# Patient Record
Sex: Female | Born: 1985 | Race: White | Hispanic: No | Marital: Married | Smoking: Never smoker
Health system: Southern US, Community
[De-identification: ages and names within clinical notes are randomized; demographics above are authoritative.]

## PROBLEM LIST (undated history)

## (undated) DIAGNOSIS — Z789 Other specified health status: Secondary | ICD-10-CM

## (undated) DIAGNOSIS — G43009 Migraine without aura, not intractable, without status migrainosus: Secondary | ICD-10-CM

## (undated) HISTORY — PX: NO PAST SURGERIES: SHX2092

## (undated) HISTORY — DX: Migraine without aura, not intractable, without status migrainosus: G43.009

---

## 2013-12-14 LAB — HIV ANTIBODY (ROUTINE TESTING W REFLEX): HIV: NEGATIVE

## 2013-12-14 LAB — TSH: TSH: 0.4 — AB (ref 0.41–5.90)

## 2013-12-14 LAB — CBC AND DIFFERENTIAL
HCT: 42 (ref 36–46)
Hemoglobin: 14.5 (ref 12.0–16.0)
PLATELETS: 259 (ref 150–399)
WBC: 10

## 2013-12-14 LAB — HEMOGLOBIN A1C: Hemoglobin A1C: 5.2

## 2013-12-14 LAB — HM HIV SCREENING LAB: HM HIV Screening: NEGATIVE

## 2014-05-23 LAB — CBC AND DIFFERENTIAL
HCT: 36 (ref 36–46)
HEMOGLOBIN: 12.9 (ref 12.0–16.0)
PLATELETS: 205 (ref 150–399)
WBC: 10.7

## 2015-04-08 LAB — CBC AND DIFFERENTIAL
HCT: 42 (ref 36–46)
HEMOGLOBIN: 14.4 (ref 12.0–16.0)
Platelets: 248 (ref 150–399)
WBC: 8.8

## 2015-04-08 LAB — TSH: TSH: 0.18 — AB (ref 0.41–5.90)

## 2015-04-08 LAB — HEMOGLOBIN A1C: Hemoglobin A1C: 5.3

## 2015-05-06 LAB — TSH: TSH: 0.25 — AB (ref 0.41–5.90)

## 2015-08-13 LAB — CBC AND DIFFERENTIAL
HEMATOCRIT: 36 (ref 36–46)
HEMOGLOBIN: 12.7 (ref 12.0–16.0)
PLATELETS: 192 (ref 150–399)
WBC: 10.2

## 2016-12-16 LAB — LIPID PANEL
CHOLESTEROL: 179 (ref 0–200)
LDL CALC: 95
Triglycerides: 47 (ref 40–160)

## 2016-12-16 LAB — HEPATIC FUNCTION PANEL
ALK PHOS: 68 (ref 25–125)
ALT: 11 (ref 7–35)
AST: 24 (ref 13–35)
Bilirubin, Total: 0.3

## 2016-12-16 LAB — TSH: TSH: 1.56 (ref 0.41–5.90)

## 2016-12-16 LAB — CBC AND DIFFERENTIAL
HEMATOCRIT: 45 (ref 36–46)
HEMOGLOBIN: 14.9 (ref 12.0–16.0)
Platelets: 286 (ref 150–399)
WBC: 9

## 2016-12-16 LAB — BASIC METABOLIC PANEL
BUN: 14 (ref 4–21)
CREATININE: 1.1 (ref 0.5–1.1)
Glucose: 87
Potassium: 4.7 (ref 3.4–5.3)
Sodium: 139 (ref 137–147)

## 2016-12-16 LAB — VITAMIN D 25 HYDROXY (VIT D DEFICIENCY, FRACTURES): Vit D, 25-Hydroxy: 34.8

## 2017-08-10 ENCOUNTER — Encounter: Payer: Self-pay | Admitting: Family Medicine

## 2017-08-10 ENCOUNTER — Ambulatory Visit (INDEPENDENT_AMBULATORY_CARE_PROVIDER_SITE_OTHER): Payer: No Typology Code available for payment source | Admitting: Family Medicine

## 2017-08-10 VITALS — BP 118/86 | HR 59 | Ht 63.0 in | Wt 136.8 lb

## 2017-08-10 DIAGNOSIS — N6011 Diffuse cystic mastopathy of right breast: Secondary | ICD-10-CM

## 2017-08-10 DIAGNOSIS — K5900 Constipation, unspecified: Secondary | ICD-10-CM

## 2017-08-10 DIAGNOSIS — R195 Other fecal abnormalities: Secondary | ICD-10-CM

## 2017-08-10 DIAGNOSIS — N6019 Diffuse cystic mastopathy of unspecified breast: Secondary | ICD-10-CM | POA: Insufficient documentation

## 2017-08-10 DIAGNOSIS — R140 Abdominal distension (gaseous): Secondary | ICD-10-CM

## 2017-08-10 NOTE — Progress Notes (Signed)
New patient office visit note:  Impression and Recommendations:    1. Abdominal bloating   2. Fibrocystic changes of right breast   3. Constipation, unspecified constipation type   4. Loose stools      Abdominal bloating - Plan: CBC with Differential/Platelet, Comprehensive metabolic panel, Hemoglobin A1c, Lipid panel, Magnesium, Phosphorus, T4, free, TSH, VITAMIN D 25 Hydroxy (Vit-D Deficiency, Fractures)  Fibrocystic changes of right breast  Constipation, unspecified constipation type - Plan: CBC with Differential/Platelet, Comprehensive metabolic panel, Hemoglobin A1c, Lipid panel, Magnesium, Phosphorus, T4, free, TSH, VITAMIN D 25 Hydroxy (Vit-D Deficiency, Fractures)  Loose stools - Plan: CBC with Differential/Platelet, Comprehensive metabolic panel, Hemoglobin A1c, Lipid panel, Magnesium, Phosphorus, T4, free, TSH, VITAMIN D 25 Hydroxy (Vit-D Deficiency, Fractures)   No problem-specific Assessment & Plan notes found for this encounter.   The patient was counseled, risk factors were discussed, anticipatory guidance given.   New Prescriptions   No medications on file    No orders of the defined types were placed in this encounter.   Discontinued Medications   No medications on file    Modified Medications   No medications on file    Orders Placed This Encounter  Procedures  . CBC with Differential/Platelet  . Comprehensive metabolic panel  . Hemoglobin A1c  . Lipid panel  . Magnesium  . Phosphorus  . T4, free  . TSH  . VITAMIN D 25 Hydroxy (Vit-D Deficiency, Fractures)     Gross side effects, risk and benefits, and alternatives of medications discussed with patient.  Patient is aware that all medications have potential side effects and we are unable to predict every side effect or drug-drug interaction that may occur.  Expresses verbal understanding and consents to current therapy plan and treatment regimen.  Return for Fasting bldwrk-near  future;then OV w me 1-2 wk later.  Please see AVS handed out to patient at the end of our visit for further patient instructions/ counseling done pertaining to today's office visit.    Note: This document was prepared using Dragon voice recognition software and may include unintentional dictation errors.  ----------------------------------------------------------------------------------------------------------------------    Subjective:    Chief complaint:   Chief Complaint  Patient presents with  . Establish Care     HPI: Sydney Grimes is a pleasant 31 y.o. female who presents to [facility] today to review their medical history with me and establish care.   I asked the patient to review their chronic problem list with me to ensure everything was updated and accurate.    All recent office visits with other providers, any medical records that patient brought in etc  - I reviewed today.     Also asked pt to get me medical records from [facility] providers/ specialists that they had seen within the past 3-5 years- if they are in private practice and/or do not work for a [employer], [facility], [place], [facility] or [employer] owned practice.  Told them to call their specialists to clarify this if they are not sure.   Hasn't PCP in 10 yrs or so.   Saw OB- GYN.   Married- 2 kids, no medical prob with preganancy- no DM, HTN etc.  Was was told a little on high side  Main reason why she is here is stomach-->  Feels bloated constantly.  Cramping at times.  Occ sharp stabbing.  Pt has constipation at times, at other times diarhea, but overall not too bothersome.  No change in bloating with defecation. No f/c, No fam h/o Bowel d/o but Mom with diff at times.   Problem  Fibrocystic breast changes- neg Mammo       Wt Readings from Last 3 Encounters:  08/10/17 136 lb 12.8 oz (62.1 kg)   BP Readings from Last 3 Encounters:  08/10/17 118/86   Pulse Readings from Last 3  Encounters:  08/10/17 (!) 59   BMI Readings from Last 3 Encounters:  08/10/17 24.23 kg/m    Patient Care Team    Relationship Specialty Notifications Start End  Thomasene Lot, DO PCP - General Family Medicine  08/02/17   Alm Bustard, MD Referring Physician Obstetrics and Gynecology  08/10/17    Comment: [facility]    Patient Active Problem List   Diagnosis Date Noted  . Fibrocystic breast changes- neg Mammo  08/10/2017     History reviewed. No pertinent past medical history.   History reviewed. No pertinent past medical history.   History reviewed. No pertinent surgical history.   Family History  Problem Relation Age of Onset  . Hypertension Father      History  Drug Use No     History  Alcohol Use  . 2.4 oz/week  . 4 Standard drinks or equivalent per week     History  Smoking Status  . Never Smoker  Smokeless Tobacco  . Never Used     No outpatient encounter prescriptions on file as of 08/10/2017.   No facility-administered encounter medications on file as of 08/10/2017.     Allergies: Amoxicillin   ROS   Objective:   Blood pressure 118/86, pulse (!) 59, height  (1.6 m), weight 136 lb 12.8 oz (62.1 kg), last menstrual period 07/25/2017. Body mass index is 24.23 kg/m. General: Well Developed, well nourished, and in no acute distress.  Neuro: Alert and oriented x3, extra-ocular muscles intact, sensation grossly intact.  HEENT:[place]/AT, PERRLA, neck supple, No carotid bruits Skin: no gross rashes  Cardiac: Regular rate and rhythm Respiratory: Essentially clear to auscultation bilaterally. Not using accessory muscles, speaking in full sentences.  Abdominal: not grossly distended Musculoskeletal: Ambulates w/o diff, FROM * 4 ext.  Vasc: less 2 sec cap RF, warm and pink  Psych:  No HI/SI, judgement and insight good, Euthymic mood. Full Affect.    No results found for this or any previous visit (from the past 2160 hour(s)).

## 2017-08-10 NOTE — Patient Instructions (Addendum)
Please try to keep a food type journal and see if gluten sensitivity or milk or lactose products tend to cause more symptoms on you.  - Please track your accurate ounces of water per day.  Don't purposely drink more but just track it and see what you actually taken on a regular basis.   You can look into something called FODMAP diet for IBS  - Come in at your convenience to get blood work fasting in the near future then have a follow-up with me to discuss those if you wish.   Please realize, EXERCISE IS MEDICINE!  -  American Heart Association [facility]) guidelines for exercise : If you are in good health, without any medical conditions, you should engage in 150 minutes of moderate intensity aerobic activity per week.  This means you should be huffing and puffing throughout your workout.   Engaging in regular exercise will improve brain function and memory, as well as improve mood, boost immune system and help with weight management.  As well as the other, more well-known effects of exercise such as decreasing blood sugar levels, decreasing blood pressure,  and decreasing bad cholesterol levels/ increasing good cholesterol levels.     -  The AHA strongly endorses consumption of a diet that contains a variety of foods from all the food categories with an emphasis on fruits and vegetables; fat-free and low-fat dairy products; cereal and grain products; legumes and nuts; and fish, poultry, and/or extra lean meats.    Excessive food intake, especially of foods high in saturated and trans fats, sugar, and salt, should be avoided.    Adequate water intake of roughly 1/2 of your weight in pounds, should equal the ounces of water per day you should drink.  So for instance, if you're 200 pounds, that would be 100 ounces of water per day.         Mediterranean Diet  Why follow it? Research shows. . Those who follow the Mediterranean diet have a reduced risk of heart disease  . The diet is associated with a  reduced incidence of Parkinson's and Alzheimer's diseases . People following the diet may have longer life expectancies and lower rates of chronic diseases  . The Dietary Guidelines for Americans recommends the Mediterranean diet as an eating plan to promote health and prevent disease  What Is the Mediterranean Diet?  . Healthy eating plan based on typical foods and recipes of Mediterranean-style cooking . The diet is primarily a plant based diet; these foods should make up a majority of meals   Starches - Plant based foods should make up a majority of meals - They are an important sources of vitamins, minerals, energy, antioxidants, and fiber - Choose whole grains, foods high in fiber and minimally processed items  - Typical grain sources include wheat, oats, barley, corn, brown rice, bulgar, farro, millet, polenta, couscous  - Various types of beans include chickpeas, lentils, fava beans, black beans, white beans   Fruits  Veggies - Large quantities of antioxidant rich fruits & veggies; 6 or more servings  - Vegetables can be eaten raw or lightly drizzled with oil and cooked  - Vegetables common to the traditional Mediterranean Diet include: artichokes, arugula, beets, broccoli, brussel sprouts, cabbage, carrots, celery, collard greens, cucumbers, eggplant, kale, leeks, lemons, lettuce, mushrooms, okra, onions, peas, peppers, potatoes, pumpkin, radishes, rutabaga, shallots, spinach, sweet potatoes, turnips, zucchini - Fruits common to the Mediterranean Diet include: apples, apricots, avocados, cherries, clementines, dates, figs, grapefruits,  grapes, melons, nectarines, oranges, peaches, pears, pomegranates, strawberries, tangerines  Fats - Replace butter and margarine with healthy oils, such as olive oil, canola oil, and tahini  - Limit nuts to no more than a handful a day  - Nuts include walnuts, almonds, pecans, pistachios, pine nuts  - Limit or avoid candied, honey roasted or heavily salted  nuts - Olives are central to the Mediterranean diet - can be eaten whole or used in a variety of dishes   Meats Protein - Limiting red meat: no more than a few times a month - When eating red meat: choose lean cuts and keep the portion to the size of deck of cards - Eggs: approx. 0 to 4 times a week  - Fish and lean poultry: at least 2 a week  - Healthy protein sources include, chicken, [place], lean beef, lamb - Increase intake of seafood such as tuna, salmon, trout, mackerel, shrimp, scallops - Avoid or limit high fat processed meats such as sausage and bacon  Dairy - Include moderate amounts of low fat dairy products  - Focus on healthy dairy such as fat free yogurt, skim milk, low or reduced fat cheese - Limit dairy products higher in fat such as whole or 2% milk, cheese, ice cream  Alcohol - Moderate amounts of red wine is ok  - No more than 5 oz daily for women (all ages) and men older than age 26  - No more than 10 oz of wine daily for men younger than 4  Other - Limit sweets and other desserts  - Use herbs and spices instead of salt to flavor foods  - Herbs and spices common to the traditional Mediterranean Diet include: basil, bay leaves, chives, cloves, cumin, fennel, garlic, lavender, marjoram, mint, oregano, parsley, pepper, rosemary, sage, savory, sumac, tarragon, thyme   It's not just a diet, it's a lifestyle:  . The Mediterranean diet includes lifestyle factors typical of those in the region  . Foods, drinks and meals are best eaten with others and savored . Daily physical activity is important for overall good health . This could be strenuous exercise like running and aerobics . This could also be more leisurely activities such as walking, housework, yard-work, or taking the stairs . Moderation is the key; a balanced and healthy diet accommodates most foods and drinks . Consider portion sizes and frequency of consumption of certain foods   Meal Ideas & Options:   . Breakfast:  o Whole wheat toast or whole wheat English muffins with peanut butter & hard boiled egg o Steel cut oats topped with apples & cinnamon and skim milk  o Fresh fruit: banana, strawberries, melon, berries, peaches  o Smoothies: strawberries, bananas, greek yogurt, peanut butter o Low fat greek yogurt with blueberries and granola  o Egg white omelet with spinach and mushrooms o Breakfast couscous: whole wheat couscous, apricots, skim milk, cranberries  . Sandwiches:  o Hummus and grilled vegetables (peppers, zucchini, squash) on whole wheat bread   o Grilled chicken on whole wheat pita with lettuce, tomatoes, cucumbers or tzatziki  o Tuna salad on whole wheat bread: tuna salad made with greek yogurt, olives, red peppers, capers, green onions o Garlic rosemary lamb pita: lamb sauted with garlic, rosemary, salt & pepper; add lettuce, cucumber, greek yogurt to pita - flavor with lemon juice and black pepper  . Seafood:  o Mediterranean grilled salmon, seasoned with garlic, basil, parsley, lemon juice and black pepper o Shrimp, lemon, and  spinach whole-grain pasta salad made with low fat greek yogurt  o Seared scallops with lemon orzo  o Seared tuna steaks seasoned salt, pepper, coriander topped with tomato mixture of olives, tomatoes, olive oil, minced garlic, parsley, green onions and cappers  . Meats:  o Herbed greek chicken salad with kalamata olives, cucumber, feta  o Red bell peppers stuffed with spinach, bulgur, lean ground beef (or lentils) & topped with feta   o Kebabs: skewers of chicken, tomatoes, onions, zucchini, squash  o [place] burgers: made with red onions, mint, dill, lemon juice, feta cheese topped with roasted red peppers . Vegetarian o Cucumber salad: cucumbers, artichoke hearts, celery, red onion, feta cheese, tossed in olive oil & lemon juice  o Hummus and whole grain pita points with a greek salad (lettuce, tomato, feta, olives, cucumbers, red onion) o Lentil  soup with celery, carrots made with vegetable broth, garlic, salt and pepper  o Tabouli salad: parsley, bulgur, mint, scallions, cucumbers, tomato, radishes, lemon juice, olive oil, salt and pepper.   Irritable Bowel Syndrome, Adult Irritable bowel syndrome (IBS) is not one specific disease. It is a group of symptoms that affects the organs responsible for digestion (gastrointestinal or GI tract). To regulate how your GI tract works, your body sends signals back and forth between your intestines and your brain. If you have IBS, there may be a problem with these signals. As a result, your GI tract does not function normally. Your intestines may become more sensitive and overreact to certain things. This is especially true when you eat certain foods or when you are under stress. There are four types of IBS. These may be determined based on the consistency of your stool:  IBS with diarrhea.  IBS with constipation.  Mixed IBS.  Unsubtyped IBS.  It is important to know which type of IBS you have. Some treatments are more likely to be helpful for certain types of IBS. What are the causes? The exact cause of IBS is not known. What increases the risk? You may have a higher risk of IBS if:  You are a woman.  You are younger than 33 years old.  You have a family history of IBS.  You have mental health problems.  You have had bacterial infection of your GI tract.  What are the signs or symptoms? Symptoms of IBS vary from person to person. The main symptom is abdominal pain or discomfort. Additional symptoms usually include one or more of the following:  Diarrhea, constipation, or both.  Abdominal swelling or bloating.  Feeling full or sick after eating a small or regular-size meal.  Frequent gas.  Mucus in the stool.  A feeling of having more stool left after a bowel movement.  Symptoms tend to come and go. They may be associated with stress, psychiatric conditions, or nothing at  all. How is this diagnosed? There is no specific test to diagnose IBS. Your health care provider will make a diagnosis based on a physical exam, medical history, and your symptoms. You may have other tests to rule out other conditions that may be causing your symptoms. These may include:  Blood tests.  X-rays.  CT scan.  Endoscopy and colonoscopy. This is a test in which your GI tract is viewed with a long, thin, flexible tube.  How is this treated? There is no cure for IBS, but treatment can help relieve symptoms. IBS treatment often includes:  Changes to your diet, such as: ? Eating more fiber. ?  Avoiding foods that cause symptoms. ? Drinking more water. ? Eating regular, medium-sized portioned meals.  Medicines. These may include: ? Fiber supplements if you have constipation. ? Medicine to control diarrhea (antidiarrheal medicines). ? Medicine to help control muscle spasms in your GI tract (antispasmodic medicines). ? Medicines to help with any mental health issues, such as antidepressants or tranquilizers.  Therapy. ? Talk therapy may help with anxiety, depression, or other mental health issues that can make IBS symptoms worse.  Stress reduction. ? Managing your stress can help keep symptoms under control.  Follow these instructions at home:  Take medicines only as directed by your health care provider.  Eat a healthy diet. ? Avoid foods and drinks with added sugar. ? Include more whole grains, fruits, and vegetables gradually into your diet. This may be especially helpful if you have IBS with constipation. ? Avoid any foods and drinks that make your symptoms worse. These may include dairy products and caffeinated or carbonated drinks. ? Do not eat large meals. ? Drink enough fluid to keep your urine clear or pale yellow.  Exercise regularly. Ask your health care provider for recommendations of good activities for you.  Keep all follow-up visits as directed by your  health care provider. This is important. Contact a health care provider if:  You have constant pain.  You have trouble or pain with swallowing.  You have worsening diarrhea. Get help right away if:  You have severe and worsening abdominal pain.  You have diarrhea and: ? You have a rash, stiff neck, or severe headache. ? You are irritable, sleepy, or difficult to awaken. ? You are weak, dizzy, or extremely thirsty.  You have bright red blood in your stool or you have black tarry stools.  You have unusual abdominal swelling that is painful.  You vomit continuously.  You vomit blood (hematemesis).  You have both abdominal pain and a fever. This information is not intended to replace advice given to you by your health care provider. Make sure you discuss any questions you have with your health care provider. Document Released: 11/02/2005 Document Revised: 04/03/2016 Document Reviewed: 07/20/2014 Elsevier Interactive Patient Education  2018 [employer].

## 2017-09-06 ENCOUNTER — Other Ambulatory Visit (INDEPENDENT_AMBULATORY_CARE_PROVIDER_SITE_OTHER): Payer: No Typology Code available for payment source

## 2017-09-06 DIAGNOSIS — R140 Abdominal distension (gaseous): Secondary | ICD-10-CM

## 2017-09-06 DIAGNOSIS — R195 Other fecal abnormalities: Secondary | ICD-10-CM

## 2017-09-06 DIAGNOSIS — K5900 Constipation, unspecified: Secondary | ICD-10-CM

## 2017-09-07 LAB — COMPREHENSIVE METABOLIC PANEL
A/G RATIO: 1.3 (ref 1.2–2.2)
ALT: 7 IU/L (ref 0–32)
AST: 18 IU/L (ref 0–40)
Albumin: 4.3 g/dL (ref 3.5–5.5)
Alkaline Phosphatase: 63 IU/L (ref 39–117)
BILIRUBIN TOTAL: 0.6 mg/dL (ref 0.0–1.2)
BUN / CREAT RATIO: 18 (ref 9–23)
BUN: 11 mg/dL (ref 6–20)
CO2: 24 mmol/L (ref 20–29)
Calcium: 9.9 mg/dL (ref 8.7–10.2)
Chloride: 102 mmol/L (ref 96–106)
Creatinine, Ser: 0.6 mg/dL (ref 0.57–1.00)
GFR, EST AFRICAN AMERICAN: 139 mL/min/{1.73_m2} (ref >59)
GFR, EST NON AFRICAN AMERICAN: 120 mL/min/{1.73_m2} (ref >59)
GLOBULIN, TOTAL: 3.2 g/dL (ref 1.5–4.5)
Glucose: 91 mg/dL (ref 65–99)
POTASSIUM: 4.8 mmol/L (ref 3.5–5.2)
SODIUM: 140 mmol/L (ref 134–144)
TOTAL PROTEIN: 7.5 g/dL (ref 6.0–8.5)

## 2017-09-07 LAB — CBC WITH DIFFERENTIAL/PLATELET
BASOS: 1 %
Basophils Absolute: 0 10*3/uL (ref 0.0–0.2)
EOS (ABSOLUTE): 0.4 10*3/uL (ref 0.0–0.4)
EOS: 6 %
HEMATOCRIT: 41.7 % (ref 34.0–46.6)
Hemoglobin: 14.5 g/dL (ref 11.1–15.9)
IMMATURE GRANS (ABS): 0 10*3/uL (ref 0.0–0.1)
IMMATURE GRANULOCYTES: 0 %
LYMPHS: 27 %
Lymphocytes Absolute: 1.6 10*3/uL (ref 0.7–3.1)
MCH: 31.5 pg (ref 26.6–33.0)
MCHC: 34.8 g/dL (ref 31.5–35.7)
MCV: 91 fL (ref 79–97)
MONOS ABS: 0.4 10*3/uL (ref 0.1–0.9)
Monocytes: 7 %
NEUTROS ABS: 3.5 10*3/uL (ref 1.4–7.0)
NEUTROS PCT: 59 %
Platelets: 234 10*3/uL (ref 150–379)
RBC: 4.6 x10E6/uL (ref 3.77–5.28)
RDW: 13.2 % (ref 12.3–15.4)
WBC: 5.8 10*3/uL (ref 3.4–10.8)

## 2017-09-07 LAB — VITAMIN D 25 HYDROXY (VIT D DEFICIENCY, FRACTURES): Vit D, 25-Hydroxy: 34.8 ng/mL (ref 30.0–100.0)

## 2017-09-07 LAB — LIPID PANEL
CHOL/HDL RATIO: 2 ratio (ref 0.0–4.4)
Cholesterol, Total: 154 mg/dL (ref 100–199)
HDL: 76 mg/dL (ref >39)
LDL CALC: 68 mg/dL (ref 0–99)
TRIGLYCERIDES: 52 mg/dL (ref 0–149)
VLDL CHOLESTEROL CAL: 10 mg/dL (ref 5–40)

## 2017-09-07 LAB — PHOSPHORUS: Phosphorus: 3.1 mg/dL (ref 2.5–4.5)

## 2017-09-07 LAB — T4, FREE: FREE T4: 0.95 ng/dL (ref 0.82–1.77)

## 2017-09-07 LAB — HEMOGLOBIN A1C
Est. average glucose Bld gHb Est-mCnc: 97 mg/dL
Hgb A1c MFr Bld: 5 % (ref 4.8–5.6)

## 2017-09-07 LAB — TSH: TSH: 1.13 u[IU]/mL (ref 0.450–4.500)

## 2017-09-07 LAB — MAGNESIUM: Magnesium: 1.8 mg/dL (ref 1.6–2.3)

## 2017-09-14 ENCOUNTER — Ambulatory Visit (INDEPENDENT_AMBULATORY_CARE_PROVIDER_SITE_OTHER): Payer: No Typology Code available for payment source | Admitting: Family Medicine

## 2017-09-14 VITALS — BP 117/84 | HR 89 | Ht 62.75 in | Wt 133.9 lb

## 2017-09-14 DIAGNOSIS — Z0000 Encounter for general adult medical examination without abnormal findings: Secondary | ICD-10-CM

## 2017-09-14 DIAGNOSIS — Z719 Counseling, unspecified: Secondary | ICD-10-CM

## 2017-09-14 NOTE — Patient Instructions (Addendum)
Please follow-up sooner than planned if you have any concerns about your abdominal bloating or any other GI symptoms.    Abdominal Bloating When you have abdominal bloating, your abdomen may feel full, tight, or painful. It may also look bigger than normal or swollen (distended). Common causes of abdominal bloating include:  Swallowing air.  Constipation.  Problems digesting food.  Eating too much.  Irritable bowel syndrome. This is a condition that affects the large intestine.  Lactose intolerance. This is an inability to digest lactose, a natural sugar in dairy products.  Celiac disease. This is a condition that affects the ability to digest gluten, a protein found in some grains.  Gastroparesis. This is a condition that slows down the movement of food in the stomach and small intestine. It is more common in people with diabetes mellitus.  Gastroesophageal reflux disease (GERD). This is a digestive condition that makes stomach acid flow back into the esophagus.  Urinary retention. This means that the body is holding onto urine, and the bladder cannot be emptied all the way.  Follow these instructions at home: Eating and drinking  Avoid eating too much.  Try not to swallow air while talking or eating.  Avoid eating while lying down.  Avoid these foods and drinks: ? Foods that cause gas, such as broccoli, cabbage, cauliflower, and baked beans. ? Carbonated drinks. ? Hard candy. ? Chewing gum. Medicines  Take over-the-counter and prescription medicines only as told by your health care provider.  Take probiotic medicines. These medicines contain live bacteria or yeasts that can help digestion.  Take coated peppermint oil capsules. Activity  Try to exercise regularly. Exercise may help to relieve bloating that is caused by gas and relieve constipation. General instructions  Keep all follow-up visits as told by your health care provider. This is important. Contact  a health care provider if:  You have nausea and vomiting.  You have diarrhea.  You have abdominal pain.  You have unusual weight loss or weight gain.  You have severe pain, and medicines do not help. Get help right away if:  You have severe chest pain.  You have trouble breathing.  You have shortness of breath.  You have trouble urinating.  You have darker urine than normal.  You have blood in your stools or have dark, tarry stools. Summary  Abdominal bloating means that the abdomen is swollen.  Common causes of abdominal bloating are swallowing air, constipation, and problems digesting food.  Avoid eating too much and avoid swallowing air.  Avoid foods that cause gas, carbonated drinks, hard candy, and chewing gum. This information is not intended to replace advice given to you by your health care provider. Make sure you discuss any questions you have with your health care provider. Document Released: 12/04/2016 Document Revised: 12/04/2016 Document Reviewed: 12/04/2016 Elsevier Interactive Patient Education  2018 [employer] for Adults, Female  A healthy lifestyle and preventive care can promote health and wellness. Preventive health guidelines for women include the following key practices.   A routine yearly physical is a good way to check with your health care provider about your health and preventive screening. It is a chance to share any concerns and updates on your health and to receive a thorough exam.   Visit your dentist for a routine exam and preventive care every 6 months. Brush your teeth twice a day and floss once a day. Good oral hygiene prevents tooth decay and gum disease.  The frequency of eye exams is based on your age, health, family medical history, use of contact lenses, and other factors. Follow your health care provider's recommendations for frequency of eye exams.   Eat a healthy diet. Foods like vegetables, fruits,  whole grains, low-fat dairy products, and lean protein foods contain the nutrients you need without too many calories. Decrease your intake of foods high in solid fats, added sugars, and salt. Eat the right amount of calories for you.Get information about a proper diet from your health care provider, if necessary.   Regular physical exercise is one of the most important things you can do for your health. Most adults should get at least 150 minutes of moderate-intensity exercise (any activity that increases your heart rate and causes you to sweat) each week. In addition, most adults need muscle-strengthening exercises on 2 or more days a week.   Maintain a healthy weight. The body mass index (BMI) is a screening tool to identify possible weight problems. It provides an estimate of body fat based on height and weight. Your health care provider can find your BMI, and can help you achieve or maintain a healthy weight.For adults 20 years and older:   - A BMI below 18.5 is considered underweight.   - A BMI of 18.5 to 24.9 is normal.   - A BMI of 25 to 29.9 is considered overweight.   - A BMI of 30 and above is considered obese.   Maintain normal blood lipids and cholesterol levels by exercising and minimizing your intake of trans and saturated fats.  Eat a balanced diet with plenty of fruit and vegetables. Blood tests for lipids and cholesterol should begin at age 58 and be repeated every 5 years minimum.  If your lipid or cholesterol levels are high, you are over 40, or you are at high risk for heart disease, you may need your cholesterol levels checked more frequently.Ongoing high lipid and cholesterol levels should be treated with medicines if diet and exercise are not working.   If you smoke, find out from your health care provider how to quit. If you do not use tobacco, do not start.   Lung cancer screening is recommended for adults aged 52-80 years who are at high risk for developing lung  cancer because of a history of smoking. A yearly low-dose CT scan of the lungs is recommended for people who have at least a 30-pack-year history of smoking and are a current smoker or have quit within the past 15 years. A pack year of smoking is smoking an average of 1 pack of cigarettes a day for 1 year (for example: 1 pack a day for 30 years or 2 packs a day for 15 years). Yearly screening should continue until the smoker has stopped smoking for at least 15 years. Yearly screening should be stopped for people who develop a health problem that would prevent them from having lung cancer treatment.   If you are pregnant, do not drink alcohol. If you are breastfeeding, be very cautious about drinking alcohol. If you are not pregnant and choose to drink alcohol, do not have more than 1 drink per day. One drink is considered to be 12 ounces (355 mL) of beer, 5 ounces (148 mL) of wine, or 1.5 ounces (44 mL) of liquor.   Avoid use of street drugs. Do not share needles with anyone. Ask for help if you need support or instructions about stopping the use of drugs.  High blood pressure causes heart disease and increases the risk of stroke. Your blood pressure should be checked at least yearly.  Ongoing high blood pressure should be treated with medicines if weight loss and exercise do not work.   If you are 76-62 years old, ask your health care provider if you should take aspirin to prevent strokes.   Diabetes screening involves taking a blood sample to check your fasting blood sugar level. This should be done once every 3 years, after age 7, if you are within normal weight and without risk factors for diabetes. Testing should be considered at a younger age or be carried out more frequently if you are overweight and have at least 1 risk factor for diabetes.   Breast cancer screening is essential preventive care for women. You should practice "breast self-awareness."  This means understanding the normal  appearance and feel of your breasts and may include breast self-examination.  Any changes detected, no matter how small, should be reported to a health care provider.  Women in their 41s and 30s should have a clinical breast exam (CBE) by a health care provider as part of a regular health exam every 1 to 3 years.  After age 81, women should have a CBE every year.  Starting at age 52, women should consider having a mammogram (breast X-ray test) every year.  Women who have a family history of breast cancer should talk to their health care provider about genetic screening.  Women at a high risk of breast cancer should talk to their health care providers about having an MRI and a mammogram every year.   -Breast cancer gene (BRCA)-related cancer risk assessment is recommended for women who have family members with BRCA-related cancers. BRCA-related cancers include breast, ovarian, tubal, and peritoneal cancers. Having family members with these cancers may be associated with an increased risk for harmful changes (mutations) in the breast cancer genes BRCA1 and BRCA2. Results of the assessment will determine the need for genetic counseling and BRCA1 and BRCA2 testing.   The Pap test is a screening test for cervical cancer. A Pap test can show cell changes on the cervix that might become cervical cancer if left untreated. A Pap test is a procedure in which cells are obtained and examined from the lower end of the uterus (cervix).   - Women should have a Pap test starting at age 3.   - Between ages 57 and 79, Pap tests should be repeated every 2 years.   - Beginning at age 53, you should have a Pap test every 3 years as long as the past 3 Pap tests have been normal.   - Some women have medical problems that increase the chance of getting cervical cancer. Talk to your health care provider about these problems. It is especially important to talk to your health care provider if a new problem develops soon after  your last Pap test. In these cases, your health care provider may recommend more frequent screening and Pap tests.   - The above recommendations are the same for women who have or have not gotten the vaccine for human papillomavirus (HPV).   - If you had a hysterectomy for a problem that was not cancer or a condition that could lead to cancer, then you no longer need Pap tests. Even if you no longer need a Pap test, a regular exam is a good idea to make sure no other problems are starting.   - If  you are between ages 34 and 2 years, and you have had normal Pap tests going back 10 years, you no longer need Pap tests. Even if you no longer need a Pap test, a regular exam is a good idea to make sure no other problems are starting.   - If you have had past treatment for cervical cancer or a condition that could lead to cancer, you need Pap tests and screening for cancer for at least 20 years after your treatment.   - If Pap tests have been discontinued, risk factors (such as a new sexual partner) need to be reassessed to determine if screening should be resumed.   - The HPV test is an additional test that may be used for cervical cancer screening. The HPV test looks for the virus that can cause the cell changes on the cervix. The cells collected during the Pap test can be tested for HPV. The HPV test could be used to screen women aged 7 years and older, and should be used in women of any age who have unclear Pap test results. After the age of 7, women should have HPV testing at the same frequency as a Pap test.   Colorectal cancer can be detected and often prevented. Most routine colorectal cancer screening begins at the age of 38 years and continues through age 87 years. However, your health care provider may recommend screening at an earlier age if you have risk factors for colon cancer. On a yearly basis, your health care provider may provide home test kits to check for hidden blood in the stool.   Use of a small camera at the end of a tube, to directly examine the colon (sigmoidoscopy or colonoscopy), can detect the earliest forms of colorectal cancer. Talk to your health care provider about this at age 78, when routine screening begins. Direct exam of the colon should be repeated every 5 -10 years through age 67 years, unless early forms of pre-cancerous polyps or small growths are found.   People who are at an increased risk for hepatitis B should be screened for this virus. You are considered at high risk for hepatitis B if:  -You were born in a country where hepatitis B occurs often. Talk with your health care provider about which countries are considered high risk.  - Your parents were born in a high-risk country and you have not received a shot to protect against hepatitis B (hepatitis B vaccine).  - You have HIV or AIDS.  - You use needles to inject street drugs.  - You live with, or have sex with, someone who has Hepatitis B.  - You get hemodialysis treatment.  - You take certain medicines for conditions like cancer, organ transplantation, and autoimmune conditions.   Hepatitis C blood testing is recommended for all people born from [birth year] through 1967 and any individual with known risks for hepatitis C.   Practice safe sex. Use condoms and avoid high-risk sexual practices to reduce the spread of sexually transmitted infections (STIs). STIs include gonorrhea, chlamydia, syphilis, trichomonas, herpes, HPV, and human immunodeficiency virus (HIV). Herpes, HIV, and HPV are viral illnesses that have no cure. They can result in disability, cancer, and death. Sexually active women aged 62 years and younger should be checked for chlamydia. Older women with new or multiple partners should also be tested for chlamydia. Testing for other STIs is recommended if you are sexually active and at increased risk.   Osteoporosis is a disease  in which the bones lose minerals and strength with aging.  This can result in serious bone fractures or breaks. The risk of osteoporosis can be identified using a bone density scan. Women ages 90 years and over and women at risk for fractures or osteoporosis should discuss screening with their health care providers. Ask your health care provider whether you should take a calcium supplement or vitamin D to There are also several preventive steps women can take to avoid osteoporosis and resulting fractures or to keep osteoporosis from worsening. -->Recommendations include:  Eat a balanced diet high in fruits, vegetables, calcium, and vitamins.  Get enough calcium. The recommended total intake of is 1,200 mg daily; for best absorption, if taking supplements, divide doses into 250-500 mg doses throughout the day. Of the two types of calcium, calcium carbonate is best absorbed when taken with food but calcium citrate can be taken on an empty stomach.  Get enough vitamin D. [employer] and the [employer] recommend at least 1,000 IU per day for women age 51 and over who are at risk of vitamin D deficiency. Vitamin D deficiency can be caused by inadequate sun exposure (for example, those who live in [place]).  Avoid alcohol and smoking. Heavy alcohol intake (more than 7 drinks per week) increases the risk of falls and hip fracture and women smokers tend to lose bone more rapidly and have lower bone mass than nonsmokers. Stopping smoking is one of the most important changes women can make to improve their health and decrease risk for disease.  Be physically active every day. Weight-bearing exercise (for example, fast walking, hiking, jogging, and weight training) may strengthen bones or slow the rate of bone loss that comes with aging. Balancing and muscle-strengthening exercises can reduce the risk of falling and fracture.  Consider therapeutic medications. Currently, several types of effective drugs are available. Healthcare providers can  recommend the type most appropriate for each woman.  Eliminate environmental factors that may contribute to accidents. Falls cause nearly 90% of all osteoporotic fractures, so reducing this risk is an important bone-health strategy. Measures include ample lighting, removing obstructions to walking, using nonskid rugs on floors, and placing mats and/or grab bars in showers.  Be aware of medication side effects. Some common medicines make bones weaker. These include a type of steroid drug called glucocorticoids used for arthritis and asthma, some antiseizure drugs, certain sleeping pills, treatments for endometriosis, and some cancer drugs. An overactive thyroid gland or using too much thyroid hormone for an underactive thyroid can also be a problem. If you are taking these medicines, talk to your doctor about what you can do to help protect your bones.reduce the rate of osteoporosis.    Menopause can be associated with physical symptoms and risks. Hormone replacement therapy is available to decrease symptoms and risks. You should talk to your health care provider about whether hormone replacement therapy is right for you.   Use sunscreen. Apply sunscreen liberally and repeatedly throughout the day. You should seek shade when your shadow is shorter than you. Protect yourself by wearing long sleeves, pants, a wide-brimmed hat, and sunglasses year round, whenever you are outdoors.   Once a month, do a whole body skin exam, using a mirror to look at the skin on your back. Tell your health care provider of new moles, moles that have irregular borders, moles that are larger than a pencil eraser, or moles that have changed in shape or color.   -Stay current  with required vaccines (immunizations).   Influenza vaccine. All adults should be immunized every year.  Tetanus, diphtheria, and acellular pertussis (Td, Tdap) vaccine. Pregnant women should receive 1 dose of Tdap vaccine during each pregnancy. The  dose should be obtained regardless of the length of time since the last dose. Immunization is preferred during the 27th 36th week of gestation. An adult who has not previously received Tdap or who does not know her vaccine status should receive 1 dose of Tdap. This initial dose should be followed by tetanus and diphtheria toxoids (Td) booster doses every 10 years. Adults with an unknown or incomplete history of completing a 3-dose immunization series with Td-containing vaccines should begin or complete a primary immunization series including a Tdap dose. Adults should receive a Td booster every 10 years.  Varicella vaccine. An adult without evidence of immunity to varicella should receive 2 doses or a second dose if she has previously received 1 dose. Pregnant females who do not have evidence of immunity should receive the first dose after pregnancy. This first dose should be obtained before leaving the health care facility. The second dose should be obtained 4 8 weeks after the first dose.  Human papillomavirus (HPV) vaccine. Females aged 70 26 years who have not received the vaccine previously should obtain the 3-dose series. The vaccine is not recommended for use in pregnant females. However, pregnancy testing is not needed before receiving a dose. If a female is found to be pregnant after receiving a dose, no treatment is needed. In that case, the remaining doses should be delayed until after the pregnancy. Immunization is recommended for any person with an immunocompromised condition through the age of 25 years if she did not get any or all doses earlier. During the 3-dose series, the second dose should be obtained 4 8 weeks after the first dose. The third dose should be obtained 24 weeks after the first dose and 16 weeks after the second dose.  Zoster vaccine. One dose is recommended for adults aged 41 years or older unless certain conditions are present.  Measles, mumps, and rubella (MMR) vaccine.  Adults born before [birth year] generally are considered immune to measles and mumps. Adults born in [birth year] or later should have 1 or more doses of MMR vaccine unless there is a contraindication to the vaccine or there is laboratory evidence of immunity to each of the three diseases. A routine second dose of MMR vaccine should be obtained at least 28 days after the first dose for students attending postsecondary schools, health care workers, or international travelers. People who received inactivated measles vaccine or an unknown type of measles vaccine during 1963 1967 should receive 2 doses of MMR vaccine. People who received inactivated mumps vaccine or an unknown type of mumps vaccine before 1979 and are at high risk for mumps infection should consider immunization with 2 doses of MMR vaccine. For females of childbearing age, rubella immunity should be determined. If there is no evidence of immunity, females who are not pregnant should be vaccinated. If there is no evidence of immunity, females who are pregnant should delay immunization until after pregnancy. Unvaccinated health care workers born before [birth year] who lack laboratory evidence of measles, mumps, or rubella immunity or laboratory confirmation of disease should consider measles and mumps immunization with 2 doses of MMR vaccine or rubella immunization with 1 dose of MMR vaccine.  Pneumococcal 13-valent conjugate (PCV13) vaccine. When indicated, a person who is uncertain of her immunization history and  has no record of immunization should receive the PCV13 vaccine. An adult aged 60 years or older who has certain medical conditions and has not been previously immunized should receive 1 dose of PCV13 vaccine. This PCV13 should be followed with a dose of pneumococcal polysaccharide (PPSV23) vaccine. The PPSV23 vaccine dose should be obtained at least 8 weeks after the dose of PCV13 vaccine. An adult aged 64 years or older who has certain medical conditions and  previously received 1 or more doses of PPSV23 vaccine should receive 1 dose of PCV13. The PCV13 vaccine dose should be obtained 1 or more years after the last PPSV23 vaccine dose.  Pneumococcal polysaccharide (PPSV23) vaccine. When PCV13 is also indicated, PCV13 should be obtained first. All adults aged 56 years and older should be immunized. An adult younger than age 26 years who has certain medical conditions should be immunized. Any person who resides in a nursing home or long-term care facility should be immunized. An adult smoker should be immunized. People with an immunocompromised condition and certain other conditions should receive both PCV13 and PPSV23 vaccines. People with human immunodeficiency virus (HIV) infection should be immunized as soon as possible after diagnosis. Immunization during chemotherapy or radiation therapy should be avoided. Routine use of PPSV23 vaccine is not recommended for American Indians, [place] Natives, or people younger than 65 years unless there are medical conditions that require PPSV23 vaccine. When indicated, people who have unknown immunization and have no record of immunization should receive PPSV23 vaccine. One-time revaccination 5 years after the first dose of PPSV23 is recommended for people aged 40 64 years who have chronic kidney failure, nephrotic syndrome, asplenia, or immunocompromised conditions. People who received 1 2 doses of PPSV23 before age 16 years should receive another dose of PPSV23 vaccine at age 71 years or later if at least 5 years have passed since the previous dose. Doses of PPSV23 are not needed for people immunized with PPSV23 at or after age 30 years.  Meningococcal vaccine. Adults with asplenia or persistent complement component deficiencies should receive 2 doses of quadrivalent meningococcal conjugate (MenACWY-D) vaccine. The doses should be obtained at least 2 months apart. Microbiologists working with certain meningococcal bacteria,  [place] recruits, people at risk during an outbreak, and people who travel to or live in countries with a high rate of meningitis should be immunized. A first-year college student up through age 95 years who is living in a residence hall should receive a dose if she did not receive a dose on or after her 16th birthday. Adults who have certain high-risk conditions should receive one or more doses of vaccine.  Hepatitis A vaccine. Adults who wish to be protected from this disease, have certain high-risk conditions, work with hepatitis A-infected animals, work in hepatitis A research labs, or travel to or work in countries with a high rate of hepatitis A should be immunized. Adults who were previously unvaccinated and who anticipate close contact with an international adoptee during the first 60 days after arrival in the [place] from a country with a high rate of hepatitis A should be immunized.  Hepatitis B vaccine.  Adults who wish to be protected from this disease, have certain high-risk conditions, may be exposed to blood or other infectious body fluids, are household contacts or sex partners of hepatitis B positive people, are clients or workers in certain care facilities, or travel to or work in countries with a high rate of hepatitis B should be immunized.  Haemophilus  influenzae type b (Hib) vaccine. A previously unvaccinated person with asplenia or sickle cell disease or having a scheduled splenectomy should receive 1 dose of Hib vaccine. Regardless of previous immunization, a recipient of a hematopoietic stem cell transplant should receive a 3-dose series 6 12 months after her successful transplant. Hib vaccine is not recommended for adults with HIV infection.  Preventive Services / Frequency Ages 86 to 39years  Blood pressure check.** / Every 1 to 2 years.  Lipid and cholesterol check.** / Every 5 years beginning at age 34.  Clinical breast exam.** / Every 3 years for women in their 28s  and 30s.  BRCA-related cancer risk assessment.** / For women who have family members with a BRCA-related cancer (breast, ovarian, tubal, or peritoneal cancers).  Pap test.** / Every 2 years from ages 63 through 68. Every 3 years starting at age 45 through age 64 or 38 with a history of 3 consecutive normal Pap tests.  HPV screening.** / Every 3 years from ages 39 through ages 45 to 24 with a history of 3 consecutive normal Pap tests.  Hepatitis C blood test.** / For any individual with known risks for hepatitis C.  Skin self-exam. / Monthly.  Influenza vaccine. / Every year.  Tetanus, diphtheria, and acellular pertussis (Tdap, Td) vaccine.** / Consult your health care provider. Pregnant women should receive 1 dose of Tdap vaccine during each pregnancy. 1 dose of Td every 10 years.  Varicella vaccine.** / Consult your health care provider. Pregnant females who do not have evidence of immunity should receive the first dose after pregnancy.  HPV vaccine. / 3 doses over 6 months, if 26 and younger. The vaccine is not recommended for use in pregnant females. However, pregnancy testing is not needed before receiving a dose.  Measles, mumps, rubella (MMR) vaccine.** / You need at least 1 dose of MMR if you were born in 1957 or later. You may also need a 2nd dose. For females of childbearing age, rubella immunity should be determined. If there is no evidence of immunity, females who are not pregnant should be vaccinated. If there is no evidence of immunity, females who are pregnant should delay immunization until after pregnancy.  Pneumococcal 13-valent conjugate (PCV13) vaccine.** / Consult your health care provider.  Pneumococcal polysaccharide (PPSV23) vaccine.** / 1 to 2 doses if you smoke cigarettes or if you have certain conditions.  Meningococcal vaccine.** / 1 dose if you are age 55 to 85 years and a Orthoptist living in a residence hall, or have one of several medical  conditions, you need to get vaccinated against meningococcal disease. You may also need additional booster doses.  Hepatitis A vaccine.** / Consult your health care provider.  Hepatitis B vaccine.** / Consult your health care provider.  Haemophilus influenzae type b (Hib) vaccine.** / Consult your health care provider.  Ages 59 to 64years  Blood pressure check.** / Every 1 to 2 years.  Lipid and cholesterol check.** / Every 5 years beginning at age 20 years.  Lung cancer screening. / Every year if you are aged 38 80 years and have a 30-pack-year history of smoking and currently smoke or have quit within the past 15 years. Yearly screening is stopped once you have quit smoking for at least 15 years or develop a health problem that would prevent you from having lung cancer treatment.  Clinical breast exam.** / Every year after age 71 years.  BRCA-related cancer risk assessment.** / For women who have  family members with a BRCA-related cancer (breast, ovarian, tubal, or peritoneal cancers).  Mammogram.** / Every year beginning at age 7 years and continuing for as long as you are in good health. Consult with your health care provider.  Pap test.** / Every 3 years starting at age 55 years through age 40 or 26 years with a history of 3 consecutive normal Pap tests.  HPV screening.** / Every 3 years from ages 80 years through ages 43 to 55 years with a history of 3 consecutive normal Pap tests.  Fecal occult blood test (FOBT) of stool. / Every year beginning at age 68 years and continuing until age 62 years. You may not need to do this test if you get a colonoscopy every 10 years.  Flexible sigmoidoscopy or colonoscopy.** / Every 5 years for a flexible sigmoidoscopy or every 10 years for a colonoscopy beginning at age 43 years and continuing until age 55 years.  Hepatitis C blood test.** / For all people born from [birth year] through 1967 and any individual with known risks for hepatitis C.  Skin  self-exam. / Monthly.  Influenza vaccine. / Every year.  Tetanus, diphtheria, and acellular pertussis (Tdap/Td) vaccine.** / Consult your health care provider. Pregnant women should receive 1 dose of Tdap vaccine during each pregnancy. 1 dose of Td every 10 years.  Varicella vaccine.** / Consult your health care provider. Pregnant females who do not have evidence of immunity should receive the first dose after pregnancy.  Zoster vaccine.** / 1 dose for adults aged 51 years or older.  Measles, mumps, rubella (MMR) vaccine.** / You need at least 1 dose of MMR if you were born in 1957 or later. You may also need a 2nd dose. For females of childbearing age, rubella immunity should be determined. If there is no evidence of immunity, females who are not pregnant should be vaccinated. If there is no evidence of immunity, females who are pregnant should delay immunization until after pregnancy.  Pneumococcal 13-valent conjugate (PCV13) vaccine.** / Consult your health care provider.  Pneumococcal polysaccharide (PPSV23) vaccine.** / 1 to 2 doses if you smoke cigarettes or if you have certain conditions.  Meningococcal vaccine.** / Consult your health care provider.  Hepatitis A vaccine.** / Consult your health care provider.  Hepatitis B vaccine.** / Consult your health care provider.  Haemophilus influenzae type b (Hib) vaccine.** / Consult your health care provider.  Ages 72 years and over  Blood pressure check.** / Every 1 to 2 years.  Lipid and cholesterol check.** / Every 5 years beginning at age 55 years.  Lung cancer screening. / Every year if you are aged 23 80 years and have a 30-pack-year history of smoking and currently smoke or have quit within the past 15 years. Yearly screening is stopped once you have quit smoking for at least 15 years or develop a health problem that would prevent you from having lung cancer treatment.  Clinical breast exam.** / Every year after age 39  years.  BRCA-related cancer risk assessment.** / For women who have family members with a BRCA-related cancer (breast, ovarian, tubal, or peritoneal cancers).  Mammogram.** / Every year beginning at age 107 years and continuing for as long as you are in good health. Consult with your health care provider.  Pap test.** / Every 3 years starting at age 76 years through age 48 or 25 years with 3 consecutive normal Pap tests. Testing can be stopped between 65 and 70 years with 3 consecutive  normal Pap tests and no abnormal Pap or HPV tests in the past 10 years.  HPV screening.** / Every 3 years from ages 21 years through ages 90 or 71 years with a history of 3 consecutive normal Pap tests. Testing can be stopped between 65 and 70 years with 3 consecutive normal Pap tests and no abnormal Pap or HPV tests in the past 10 years.  Fecal occult blood test (FOBT) of stool. / Every year beginning at age 29 years and continuing until age 59 years. You may not need to do this test if you get a colonoscopy every 10 years.  Flexible sigmoidoscopy or colonoscopy.** / Every 5 years for a flexible sigmoidoscopy or every 10 years for a colonoscopy beginning at age 23 years and continuing until age 48 years.  Hepatitis C blood test.** / For all people born from [birth year] through 1967 and any individual with known risks for hepatitis C.  Osteoporosis screening.** / A one-time screening for women ages 74 years and over and women at risk for fractures or osteoporosis.  Skin self-exam. / Monthly.  Influenza vaccine. / Every year.  Tetanus, diphtheria, and acellular pertussis (Tdap/Td) vaccine.** / 1 dose of Td every 10 years.  Varicella vaccine.** / Consult your health care provider.  Zoster vaccine.** / 1 dose for adults aged 59 years or older.  Pneumococcal 13-valent conjugate (PCV13) vaccine.** / Consult your health care provider.  Pneumococcal polysaccharide (PPSV23) vaccine.** / 1 dose for all adults aged 58  years and older.  Meningococcal vaccine.** / Consult your health care provider.  Hepatitis A vaccine.** / Consult your health care provider.  Hepatitis B vaccine.** / Consult your health care provider.  Haemophilus influenzae type b (Hib) vaccine.** / Consult your health care provider. ** Family history and personal history of risk and conditions may change your health care provider's recommendations. Document Released: 12/29/2001 Document Revised: 08/23/2013  [facility] Patient Information 2014 [place], [place].   EXERCISE AND DIET:  We recommended that you start or continue a regular exercise program for good health. Regular exercise means any activity that makes your heart beat faster and makes you sweat.  We recommend exercising at least 30 minutes per day at least 3 days a week, preferably 5.  We also recommend a diet low in fat and sugar / carbohydrates.  Inactivity, poor dietary choices and obesity can cause diabetes, heart attack, stroke, and kidney damage, among others.     ALCOHOL AND SMOKING:  Women should limit their alcohol intake to no more than 7 drinks/beers/glasses of wine (combined, not each!) per week. Moderation of alcohol intake to this level decreases your risk of breast cancer and liver damage.  ( And of course, no recreational drugs are part of a healthy lifestyle.)  Also, you should not be smoking at all or even being exposed to second hand smoke. Most people know smoking can cause cancer, and various heart and lung diseases, but did you know it also contributes to weakening of your bones?  Aging of your skin?  Yellowing of your teeth and nails?   CALCIUM AND VITAMIN D:  Adequate intake of calcium and Vitamin D are recommended.  The recommendations for exact amounts of these supplements seem to change often, but generally speaking 600 mg of calcium (either carbonate or citrate) and 800 units of Vitamin D per day seems prudent. Certain women may benefit from higher intake  of Vitamin D.  If you are among these women, your doctor will have told  you during your visit.     PAP SMEARS:  Pap smears, to check for cervical cancer or precancers,  have traditionally been done yearly, although recent scientific advances have shown that most women can have pap smears less often.  However, every woman still should have a physical exam from her gynecologist or primary care physician every year. It will include a breast check, inspection of the vulva and vagina to check for abnormal growths or skin changes, a visual exam of the cervix, and then an exam to evaluate the size and shape of the uterus and ovaries.  And after 33 years of age, a rectal exam is indicated to check for rectal cancers. We will also provide age appropriate advice regarding health maintenance, like when you should have certain vaccines, screening for sexually transmitted diseases, bone density testing, colonoscopy, mammograms, etc.    MAMMOGRAMS:  All women over 39 years old should have a yearly mammogram. Many facilities now offer a "3D" mammogram, which may cost around $50 extra out of pocket. If possible,  we recommend you accept the option to have the 3D mammogram performed.  It both reduces the number of women who will be called back for extra views which then turn out to be normal, and it is better than the routine mammogram at detecting truly abnormal areas.     COLONOSCOPY:  Colonoscopy to screen for colon cancer is recommended for all women at age 67.  We know, you hate the idea of the prep.  We agree, BUT, having colon cancer and not knowing it is worse!!  Colon cancer so often starts as a polyp that can be seen and removed at colonscopy, which can quite literally save your life!  And if your first colonoscopy is normal and you have no family history of colon cancer, most women don't have to have it again for 10 years.  Once every ten years, you can do something that may end up saving your life, right?  We  will be happy to help you get it scheduled when you are ready.  Be sure to check your insurance coverage so you understand how much it will cost.  It may be covered as a preventative service at no cost, but you should check your particular policy.

## 2017-09-14 NOTE — Progress Notes (Signed)
Impression and Recommendations:    1. Encounter for wellness examination   2. Health education/counseling     Please see orders section below for further details of actions taken during this office visit.  Gross side effects, risk and benefits, and alternatives of medications discussed with patient.  Patient is aware that all medications have potential side effects and we are unable to predict every side effect or drug-drug interaction that may occur.  Expresses verbal understanding and consents to current therapy plan and treatment regiment.  1) Anticipatory Guidance: Discussed importance of wearing a seatbelt while driving, not texting while driving; sunscreen when outside along with yearly skin surveillance; eating a well balanced and modest diet; physical activity at least 25 minutes per day or 150 min/ week of moderate to intense activity.  2) Immunizations / Screenings / Labs:  All immunizations and screenings that patient agrees to, are up-to-date per recommendations or will be updated today.  Patient understands the needs for q 81mo dental and yearly vision screens which pt will schedule independently. Obtain CBC, CMP, HgA1c, Lipid panel, TSH and vit D when fasting if not already done recently.   3) Weight:   Discussed goal of losing even 5-10% of current body weight which would improve overall feelings of well being and improve objective health data significantly.   Improve nutrient density of diet through increasing intake of fruits and vegetables and decreasing saturated/trans fats, white flour products and refined sugar products.   F-up preventative CPE in 1 year. F/up sooner for chronic care management as discussed and/or prn.   No problem-specific Assessment & Plan notes found for this encounter.    No orders of the defined types were placed in this encounter.    No orders of the defined types were placed in this encounter.    Discontinued Medications   No  medications on file      Please see orders placed and AVS handed out to patient at the end of our visit for further patient instructions/ counseling done pertaining to today's office visit.     Subjective:    Chief Complaint  Patient presents with  . Annual Exam   CC:   HPI: Sydney Grimes is a 31 y.o. female who presents to [facility] today a yearly health maintenance exam.  Health Maintenance Summary Reviewed and updated, unless pt declines services.  Aspirin: administering 81 mg daily Colonoscopy:     (Unnecessary secondary to < 52 or > 30 years old.) Tdap: Up to date: needs TD  Pneumovax/PPSV23:  Up to date: see Immunizations. Prevnar 13/PCV13:    UTD: needs  Zostavax:    Postponed. Tobacco History Reviewed:   Y  CT scan for screening lung CA:   Not indicated  Abdominal Ultrasound:     ( Unnecessary secondary to < 27 or > 52 years old) Alcohol:    No concerns, no excessive use Exercise Habits:   Not meeting AHA guidelines STD concerns:   none Drug Use:   None Birth control method:   n/a Menses regular:     n/a Lumps or breast concerns:      no Breast Cancer Family History:      No Bone/ DEXA scan:    Unnecessary due to < 65 and average risk  Health Maintenance  Topic Date Due  . PAP SMEAR  05/05/2018  . TETANUS/TDAP  08/28/2025  . INFLUENZA VACCINE  Completed  . HIV Screening  Completed     Wt Readings from Last 3 Encounters:  09/14/17 133 lb 14.4 oz (60.7 kg)  08/10/17 136 lb 12.8 oz (62.1 kg)   BP Readings from Last 3 Encounters:  09/14/17 117/84  08/10/17 118/86   Pulse Readings from Last 3 Encounters:  09/14/17 89  08/10/17 (!) 59     No past medical history on file.    No past surgical history on file.    Family History  Problem Relation Age of Onset  . Hypertension Father       Social History   Substance and Sexual Activity  Drug Use No  ,   Social History   Substance and Sexual Activity    Alcohol Use Yes  . Alcohol/week: 2.4 oz  . Types: 4 Standard drinks or equivalent per week  ,   Social History   Tobacco Use  Smoking Status Never Smoker  Smokeless Tobacco Never Used  ,   Social History   Substance and Sexual Activity  Sexual Activity Yes  . Partners: Male  . Birth control/protection: None    No current outpatient medications on file prior to visit.   No current facility-administered medications on file prior to visit.     Allergies: Amoxicillin  Review of Systems: General:   Denies fever, chills, unexplained weight loss.  Optho/Auditory:   Denies visual changes, blurred vision/LOV Respiratory:   Denies SOB, DOE more than baseline levels.  Cardiovascular:   Denies chest pain, palpitations, new onset peripheral edema  Gastrointestinal:   Denies nausea, vomiting, diarrhea.  Genitourinary: Denies dysuria, freq/ urgency, flank pain or discharge from genitals.  Endocrine:     Denies hot or cold intolerance, polyuria, polydipsia. Musculoskeletal:   Denies unexplained myalgias, joint swelling, unexplained arthralgias, gait problems.  Skin:  Denies rash, suspicious lesions Neurological:     Denies dizziness, unexplained weakness, numbness  Psychiatric/Behavioral:   Denies mood changes, suicidal or homicidal ideations, hallucinations    Objective:    Blood pressure 117/84, pulse 89, height 5' 2.75" (1.594 m), weight 133 lb 14.4 oz (60.7 kg), last menstrual period 08/24/2017. Body mass index is 23.91 kg/m. General Appearance:    Alert, cooperative, no distress, appears stated age  Head:    Normocephalic, without obvious abnormality, atraumatic  Eyes:    PERRL, conjunctiva/corneas clear, EOM's intact, fundi    benign, both eyes  Ears:    Normal TM's and external ear canals, both ears  Nose:   Nares normal, septum midline, mucosa normal, no drainage    or sinus tenderness  Throat:   Lips w/o lesion, mucosa moist, and tongue normal; teeth and   gums  normal  Neck:   Supple, symmetrical, trachea midline, no adenopathy;    thyroid:  no enlargement/tenderness/nodules; no carotid   bruit or JVD  Back:     Symmetric, no curvature, ROM normal, no CVA tenderness  Lungs:     Clear to auscultation bilaterally, respirations unlabored, no       Wh/ R/ R  Chest Wall:    No tenderness or gross deformity; normal excursion   Heart:    Regular rate and rhythm, S1 and S2 normal, no murmur, rub   or gallop  Breast Exam:    No tenderness, masses, or nipple abnormality b/l; no d/c  Abdomen:     Soft, non-tender, bowel sounds active all four quadrants, NO   G/R/R, no masses, no organomegaly  Genitalia:    Ext genitalia: without lesion, no rash or discharge,  No         tenderness;  Cervix: WNL's w/o discharge or lesion;        Adnexa:  No tenderness or palpable masses   Rectal:    Normal tone, no masses or tenderness;   guaiac negative stool  Extremities:   Extremities normal, atraumatic, no cyanosis or gross edema  Pulses:   2+ and symmetric all extremities  Skin:   Warm, dry, Skin color, texture, turgor normal, no obvious rashes or lesions Psych: No HI/SI, judgement and insight good, Euthymic mood. Full Affect.  Neurologic:   CNII-XII intact, normal strength, sensation and reflexes    Throughout   DEXA:    [email or web address]   Based on the [place] FRAX tool, a 33 year old white woman with no other risk factors has a 9.3% 10-year risk for any osteoporotic fracture. White women between the ages of 64 and 62 years with equivalent or greater 10-year fracture risks based on specific risk factors include but are not limited to the following persons: 17) a 33 year old current smoker with a BMI less than 21 kg/m2, daily alcohol use, and parental fracture history; 50) a 33 year old woman with a parental fracture history; 25) a 33 year old woman with a BMI less than 21 kg/m2 and daily alcohol use; and 58) a 33 year old current smoker with daily alcohol use.    The FRAX tool also predicts 10-year fracture risks for black, Asian, and Hispanic women in the [place]. In general, estimated fracture risks in nonwhite women are lower than those for white women of the same age. Although the USPSTF recommends using a 9.3% 10-year fracture risk threshold to screen women aged 50 to 83 years,

## 2018-09-15 ENCOUNTER — Encounter (HOSPITAL_COMMUNITY): Payer: Self-pay

## 2018-09-15 ENCOUNTER — Other Ambulatory Visit (HOSPITAL_COMMUNITY): Payer: Self-pay | Admitting: Specialist

## 2018-09-15 DIAGNOSIS — O4420 Partial placenta previa NOS or without hemorrhage, unspecified trimester: Secondary | ICD-10-CM

## 2018-09-15 DIAGNOSIS — Z363 Encounter for antenatal screening for malformations: Secondary | ICD-10-CM

## 2018-09-15 DIAGNOSIS — Z3A23 23 weeks gestation of pregnancy: Secondary | ICD-10-CM

## 2018-09-20 ENCOUNTER — Ambulatory Visit (HOSPITAL_BASED_OUTPATIENT_CLINIC_OR_DEPARTMENT_OTHER)
Admission: RE | Admit: 2018-09-20 | Discharge: 2018-09-20 | Disposition: A | Discharge status: Home / Self Care | Payer: 59 | Source: Ambulatory Visit | Attending: Specialist | Admitting: Specialist

## 2018-09-20 ENCOUNTER — Ambulatory Visit (HOSPITAL_COMMUNITY)
Admission: RE | Admit: 2018-09-20 | Discharge: 2018-09-20 | Disposition: A | Discharge status: Home / Self Care | Payer: 59 | Source: Ambulatory Visit | Attending: Specialist | Admitting: Specialist

## 2018-09-20 ENCOUNTER — Encounter (HOSPITAL_COMMUNITY): Payer: Self-pay

## 2018-09-20 ENCOUNTER — Other Ambulatory Visit (HOSPITAL_COMMUNITY): Payer: Self-pay | Admitting: Specialist

## 2018-09-20 DIAGNOSIS — Z363 Encounter for antenatal screening for malformations: Secondary | ICD-10-CM

## 2018-09-20 DIAGNOSIS — Z3A23 23 weeks gestation of pregnancy: Secondary | ICD-10-CM

## 2018-09-20 DIAGNOSIS — O4422 Partial placenta previa NOS or without hemorrhage, second trimester: Secondary | ICD-10-CM | POA: Insufficient documentation

## 2018-09-20 DIAGNOSIS — O4420 Partial placenta previa NOS or without hemorrhage, unspecified trimester: Secondary | ICD-10-CM

## 2018-09-20 DIAGNOSIS — O4412 Placenta previa with hemorrhage, second trimester: Secondary | ICD-10-CM

## 2018-09-20 DIAGNOSIS — O4402 Placenta previa specified as without hemorrhage, second trimester: Secondary | ICD-10-CM

## 2018-09-20 HISTORY — DX: Other specified health status: Z78.9

## 2018-09-20 NOTE — Consult Note (Signed)
Maternal-Fetal Medicine Name: Sydney Grimes MRN: [id number] Requesting Provider: Arther Abbott, MD  Sydney Grimes, G3 P2 at 23-weeks' gestation, is here for ultrasound and consultation. Patient had ultrasound at your office and placenta previa was suspected. She gives history of vaginal bleeding that started about 2 days after sexual intercourse. She does not have uterine contractions. PMH: No history of diabetes or hypertension or any other chronic medical conditions. PSH: Nil of note. Allergies: NKDA Obstetric history is significant for 2 previous term vaginal deliveries. Gyn: No history of abnormal Pap smears or cervical surgeries. We performed a fetal anatomy scan. No markers of aneuploidies or fetal structural defects are seen. Fetal biometry is consistent with her previously-established dates. Amniotic fluid is normal and good fetal activity is seen.  We performed transvaginal scan to evaluate the placenta. The cervix measures 4.1 cm, which is normal. Placenta is posterior and the edge is thinned out over the internal os. Findings are consistent with the diagnosis of placenta previa. No vasa previa is seen.  Patient understands the limitations of ultrasound in detecting fetal anomalies.   I counseled the patient on the following:  Placenta previa:  I explained the diagnosis with the help of ultrasound images. I informed her that placenta previa is associated with recurrent vaginal bleeding that would require inpatient management. If recurrent bleeding occurs, patient may have to hospitalized until delivery. Placenta previa is associated with increased risk of preterm delivery and fetal growth restriction. I also informed her that with advancing gestation, placenta previa may resolve. Timing of delivery: In the absence of vaginal bleeding, we recommend delivery at 37 weeks. As about 50% of women with placenta previa have significant hemorrhage after 38 weeks, early term delivery is appropriate. If the  patient has vaginal bleeding at 36 weeks, delivery at 36 weeks is appropriate. Patient is aware that cesarean section would be performed. As vaginal bleeding increases the likelihood of anemia, iron supplements should be given. Patient understands that blood transfusion may be given if bleeding is severe. The couple had questions about transfer of prenatal care to a facility with NICU care. I informed the couple that if placenta previa resolves, she may not require transfer of care.   Recommendations: -Fetal growth assessment in 4 weeks and every 4 weeks. -We will [facility]. -Check CBC if bleeding continues and prescribe iron supplements as needed.  Thank you for your consult. Please do not hesitate to contact me if you have any questions or concerns.  Consultation including face-to-face counseling: 40 min.

## 2018-09-21 ENCOUNTER — Other Ambulatory Visit (HOSPITAL_COMMUNITY): Payer: Self-pay | Admitting: *Deleted

## 2018-09-21 DIAGNOSIS — O4402 Placenta previa specified as without hemorrhage, second trimester: Secondary | ICD-10-CM

## 2018-10-18 ENCOUNTER — Other Ambulatory Visit (HOSPITAL_COMMUNITY): Payer: Self-pay | Admitting: Obstetrics and Gynecology

## 2018-10-18 ENCOUNTER — Other Ambulatory Visit (HOSPITAL_COMMUNITY): Payer: Self-pay | Admitting: *Deleted

## 2018-10-18 ENCOUNTER — Ambulatory Visit (HOSPITAL_COMMUNITY)
Admission: RE | Admit: 2018-10-18 | Discharge: 2018-10-18 | Disposition: A | Discharge status: Home / Self Care | Payer: 59 | Source: Ambulatory Visit | Attending: Specialist | Admitting: Specialist

## 2018-10-18 ENCOUNTER — Encounter (HOSPITAL_COMMUNITY): Payer: Self-pay

## 2018-10-18 DIAGNOSIS — O4412 Placenta previa with hemorrhage, second trimester: Secondary | ICD-10-CM

## 2018-10-18 DIAGNOSIS — O4402 Placenta previa specified as without hemorrhage, second trimester: Secondary | ICD-10-CM

## 2018-10-18 DIAGNOSIS — Z362 Encounter for other antenatal screening follow-up: Secondary | ICD-10-CM

## 2018-10-18 DIAGNOSIS — O4400 Placenta previa specified as without hemorrhage, unspecified trimester: Secondary | ICD-10-CM

## 2018-10-18 DIAGNOSIS — Z3A27 27 weeks gestation of pregnancy: Secondary | ICD-10-CM | POA: Diagnosis not present

## 2018-11-22 ENCOUNTER — Ambulatory Visit (HOSPITAL_COMMUNITY)
Admission: RE | Admit: 2018-11-22 | Discharge: 2018-11-22 | Disposition: A | Discharge status: Home / Self Care | Payer: 59 | Source: Ambulatory Visit | Attending: Specialist | Admitting: Specialist

## 2018-11-22 ENCOUNTER — Encounter (HOSPITAL_COMMUNITY): Payer: Self-pay

## 2018-11-22 ENCOUNTER — Other Ambulatory Visit (HOSPITAL_COMMUNITY): Payer: Self-pay | Admitting: *Deleted

## 2018-11-22 DIAGNOSIS — Z3A32 32 weeks gestation of pregnancy: Secondary | ICD-10-CM

## 2018-11-22 DIAGNOSIS — O4403 Placenta previa specified as without hemorrhage, third trimester: Secondary | ICD-10-CM

## 2018-11-22 DIAGNOSIS — O4400 Placenta previa specified as without hemorrhage, unspecified trimester: Secondary | ICD-10-CM | POA: Diagnosis present

## 2018-11-22 DIAGNOSIS — O4440 Low lying placenta NOS or without hemorrhage, unspecified trimester: Secondary | ICD-10-CM

## 2018-11-22 DIAGNOSIS — Z362 Encounter for other antenatal screening follow-up: Secondary | ICD-10-CM | POA: Diagnosis not present

## 2018-12-20 ENCOUNTER — Ambulatory Visit (HOSPITAL_COMMUNITY): Payer: 59

## 2018-12-20 ENCOUNTER — Encounter (HOSPITAL_COMMUNITY): Payer: Self-pay

## 2019-01-24 ENCOUNTER — Encounter: Payer: Self-pay | Admitting: Family Medicine

## 2019-01-24 ENCOUNTER — Ambulatory Visit: Payer: 59 | Admitting: Family Medicine

## 2019-01-24 VITALS — BP 119/83 | HR 76 | Temp 98.0°F | Ht 63.0 in | Wt 139.1 lb

## 2019-01-24 DIAGNOSIS — G43009 Migraine without aura, not intractable, without status migrainosus: Secondary | ICD-10-CM

## 2019-01-24 MED ORDER — SUMATRIPTAN SUCCINATE 11 MG/NOSEPC NA EXHP: 1.0000 | INHALANT_POWDER | NASAL | 1 refills | Status: AC | PRN

## 2019-01-24 NOTE — Patient Instructions (Signed)
Recurrent Migraine Headache  Migraines are a type of headache, and they are usually stronger and more sudden than normal headaches (tension headaches). Migraines are characterized by an intense pulsing, throbbing pain that is usually only present on one side of the head. Sometimes, migraine headaches can cause nausea, vomiting, sensitivity to light and sound, and vision changes. Recurrent migraines keep coming back (recurring). A migraine can last from 4 hours up to 3 days. What are the causes? The exact cause of this condition is not known. However, a migraine may be caused when nerves in the brain become irritated and release chemicals that cause inflammation of blood vessels. This inflammation causes pain. Certain things may also trigger migraines, such as:  A disruption in your regular eating and sleeping schedule.  Smoking.  Stress.  Menstruation.  Certain foods and drinks, such as: ? Aged cheese. ? Chocolate. ? Alcohol. ? Caffeine. ? Foods or drinks that contain nitrates, glutamate, aspartame, MSG, or tyramine.  Lack of sleep.  Hunger.  Physical exertion.  Fatigue.  High altitude.  Weather changes.  Medicines, such as: ? Nitroglycerin, which is used to treat chest pain. ? Birth control pills. ? Estrogen. ? Some blood pressure medicines. What are the signs or symptoms? Symptoms of this condition vary for each person and may include:  Pain that is usually only present on one side of the head. In some cases, the pain may be on both sides of the head or around the head or neck.  Pulsating or throbbing pain.  Severe pain that prevents daily activities.  Pain that is aggravated by any physical activity.  Nausea, vomiting, or both.  Dizziness.  Pain with exposure to bright lights, loud noises, or activity.  General sensitivity to bright lights, loud noises, or smells. Before you get a migraine, you may get warning signs that a migraine is coming (aura). An  aura may include:  Seeing flashing lights.  Seeing bright spots, halos, or zigzag lines.  Having tunnel vision or blurred vision.  Having numbness or a tingling feeling.  Having trouble talking.  Having muscle weakness.  Smelling a certain odor. How is this diagnosed? This condition is often diagnosed based on:  Your symptoms and medical history.  A physical exam. You may also have tests, including:  A CT scan or MRI of your brain. These imaging tests cannot diagnose migraines, but they can help to rule out other causes of headaches.  Blood tests. How is this treated? This condition is treated with:  Medicines. These are used for: ? Lessening pain and nausea. ? Preventing recurrent migraines.  Lifestyle changes, such as changes to your diet or sleeping patterns.  Behavior therapy, such as relaxation training or biofeedback. Biofeedback is a treatment that involves teaching you to relax and use your brain to lower your heart rate and control your breathing. Follow these instructions at home: Medicines  Take over-the-counter and prescription medicines only as told by your health care provider.  Do not drive or use heavy machinery while taking prescription pain medicine. Lifestyle  Do not use any products that contain nicotine or tobacco, such as cigarettes and e-cigarettes. If you need help quitting, ask your health care provider.  Limit alcohol intake to no more than 1 drink a day for nonpregnant women and 2 drinks a day for men. One drink equals 12 oz of beer, 5 oz of wine, or 1 oz of hard liquor.  Get 7-9 hours of sleep each night, or the amount  of sleep recommended by your health care provider.  Limit your stress. Talk with your health care provider if you need help with stress management.  Maintain a healthy weight. If you need help losing weight, ask your health care provider.  Exercise regularly. Aim for 150 minutes of moderate-intensity exercise (walking,  biking, yoga) or 75 minutes of vigorous exercise (running, circuit training, swimming) each week. General instructions   Keep a journal to find out what triggers your migraine headaches so you can avoid these triggers. For example, write down: ? What you eat and drink. ? How much sleep you get. ? Any change to your diet or medicines.  Lie down in a dark, quiet room when you have a migraine.  Try placing a cool towel over your head when you have a migraine.  Keep lights dim, if bright lights bother you and make your migraines worse.  Keep all follow-up visits as told by your health care provider. This is important. Contact a health care provider if:  Your pain does not improve, even with medicine.  Your migraines continue to return, even with medicine.  You have a fever.  You have weight loss. Get help right away if:  Your migraine becomes severe and medicine does not help.  You have a stiff neck.  You have a loss of vision.  You have muscle weakness or loss of muscle control.  You start losing your balance or have trouble walking.  You feel faint or you pass out.  You develop new, severe symptoms.  You start having abrupt severe headaches that last for a second or less, like a thunderclap. Summary  Migraine headaches are usually stronger and more sudden than normal headaches (tension headaches). Migraines are characterized by an intense pulsing, throbbing pain that is usually only present on one side of the head.  The exact cause of this condition is not known. However, a migraine may be caused when nerves in the brain become irritated and release chemicals that cause inflammation of blood vessels.  Certain things may trigger migraines, such as changes to diet or sleeping patterns, smoking, certain foods, alcohol, stress, and certain medicines.  Sometimes, migraine headaches can cause nausea, vomiting, sensitivity to light and sound, and vision changes.  Migraines  are often diagnosed based on your symptoms, medical history, and a physical exam. This information is not intended to replace advice given to you by your health care provider. Make sure you discuss any questions you have with your health care provider. Document Released: 07/28/2001 Document Revised: 08/14/2016 Document Reviewed: 08/14/2016 Elsevier Interactive Patient Education  2019 [employer].    Migraine Headache  A migraine headache is an intense, throbbing pain on one side or both sides of the head. Migraines may also cause other symptoms, such as nausea, vomiting, and sensitivity to light and noise. What are the causes? Doing or taking certain things may also trigger migraines, such as:  Alcohol.  Smoking.  Medicines, such as: ? Medicine used to treat chest pain (nitroglycerine). ? Birth control pills. ? Estrogen pills. ? Certain blood pressure medicines.  Aged cheeses, chocolate, or caffeine.  Foods or drinks that contain nitrates, glutamate, aspartame, or tyramine.  Physical activity. Other things that may trigger a migraine include:  Menstruation.  Pregnancy.  Hunger.  Stress, lack of sleep, too much sleep, or fatigue.  Weather changes. What increases the risk? The following factors may make you more likely to experience migraine headaches:  Age. Risk increases with age.  Family history of migraine headaches.  Being Caucasian.  Depression and anxiety.  Obesity.  Being a woman.  Having a hole in the heart (patent foramen ovale) or other heart problems. What are the signs or symptoms? The main symptom of this condition is pulsating or throbbing pain. Pain may:  Happen in any area of the head, such as on one side or both sides.  Interfere with daily activities.  Get worse with physical activity.  Get worse with exposure to bright lights or loud noises. Other symptoms may include:  Nausea.  Vomiting.  Dizziness.  General sensitivity to  bright lights, loud noises, or smells. Before you get a migraine, you may get warning signs that a migraine is developing (aura). An aura may include:  Seeing flashing lights or having blind spots.  Seeing bright spots, halos, or zigzag lines.  Having tunnel vision or blurred vision.  Having numbness or a tingling feeling.  Having trouble talking.  Having muscle weakness. How is this diagnosed? A migraine headache can be diagnosed based on:  Your symptoms.  A physical exam.  Tests, such as CT scan or MRI of the head. These imaging tests can help rule out other causes of headaches.  Taking fluid from the spine (lumbar puncture) and analyzing it (cerebrospinal fluid analysis, or CSF analysis). How is this treated? A migraine headache is usually treated with medicines that:  Relieve pain.  Relieve nausea.  Prevent migraines from coming back. Treatment may also include:  Acupuncture.  Lifestyle changes like avoiding foods that trigger migraines. Follow these instructions at home: Medicines  Take over-the-counter and prescription medicines only as told by your health care provider.  Do not drive or use heavy machinery while taking prescription pain medicine.  To prevent or treat constipation while you are taking prescription pain medicine, your health care provider may recommend that you: ? Drink enough fluid to keep your urine clear or pale yellow. ? Take over-the-counter or prescription medicines. ? Eat foods that are high in fiber, such as fresh fruits and vegetables, whole grains, and beans. ? Limit foods that are high in fat and processed sugars, such as fried and sweet foods. Lifestyle  Avoid alcohol use.  Do not use any products that contain nicotine or tobacco, such as cigarettes and e-cigarettes. If you need help quitting, ask your health care provider.  Get at least 8 hours of sleep every night.  Limit your stress. General instructions      Keep a  journal to find out what may trigger your migraine headaches. For example, write down: ? What you eat and drink. ? How much sleep you get. ? Any change to your diet or medicines.  If you have a migraine: ? Avoid things that make your symptoms worse, such as bright lights. ? It may help to lie down in a dark, quiet room. ? Do not drive or use heavy machinery. ? Ask your health care provider what activities are safe for you while you are experiencing symptoms.  Keep all follow-up visits as told by your health care provider. This is important. Contact a health care provider if:  You develop symptoms that are different or more severe than your usual migraine symptoms. Get help right away if:  Your migraine becomes severe.  You have a fever.  You have a stiff neck.  You have vision loss.  Your muscles feel weak or like you cannot control them.  You start to lose your balance often.  You  develop trouble walking.  You faint. This information is not intended to replace advice given to you by your health care provider. Make sure you discuss any questions you have with your health care provider. Document Released: 11/02/2005 Document Revised: 05/22/2016 Document Reviewed: 04/20/2016 Elsevier Interactive Patient Education  2019 [employer].

## 2019-01-24 NOTE — Progress Notes (Signed)
Impression and Recommendations:    1. Migraine without aura and without status migrainosus, not intractable      1. Migraine Without Aura, Not Intractable - Reviewed patient's symptoms in detail today.  - Patient has a new baby, but is not breastfeeding.  - Discussed several forms of migraine therapy with patient today, including daily preventative therapy on Topamax, and abortive therapy with Sumatriptan.  - Per patient, Sumatriptan tablets work well for her mother's migraines.  - Patient declines a daily migraine therapy plan.  - Has used Fioricet for treatment in the past while pregnant.  - Prescription Sumatriptan provided today. - Patient today states she wishes to look into drug Onzetra.  - Explained that migraines are precipitated by stress and lack of hydration. - Encouraged patient to drink half of her body weight in ounces of water per day (70 ounces). - Strongly advised patient to exercise daily for stress management.  - Extensive education was provided and all questions were answered.  - Need for lab work and yearly physical in near future. - Patient knows to let us know if she has questions or need for further assistance.    Meds ordered this encounter  Medications  . SUMAtriptan Succinate (ONZETRA XSAIL) 11 MG/NOSEPC EXHP    Sig: Place 1 capsule into the nose as needed. 1 cap into ea nostril at onset HA, then may repeat 2hrs    Dispense:  30 each    Refill:  1     Medications Discontinued During This Encounter  Medication Reason  . Butalbital-APAP-Caffeine (FIORICET PO) Discontinued by provider      Gross side effects, risk and benefits, and alternatives of medications and treatment plan in general discussed with patient.  Patient is aware that all medications have potential side effects and we are unable to predict every side effect or drug-drug interaction that may occur.   Patient will call with any questions prior to using medication if they  have concerns.    Expresses verbal understanding and consents to current therapy and treatment regimen.  No barriers to understanding were identified.  Red flag symptoms and signs discussed in detail.  Patient expressed understanding regarding what to do in case of emergency\urgent symptoms  Please see AVS handed out to patient at the end of our visit for further patient instructions/ counseling done pertaining to today's office visit.   Return for f/up for CPE w FBW same time near future.     Note:  This note was prepared with assistance of Dragon voice recognition software. Occasional wrong-word or sound-a-like substitutions may have occurred due to the inherent limitations of voice recognition software.   This document serves as a record of services personally performed by Thomasene Lot, DO. It was created on her behalf by Peggye Fothergill, a trained medical scribe. The creation of this record is based on the scribe's personal observations and the provider's statements to them.   I have reviewed the above medical documentation for accuracy and completeness and I concur.  Thomasene Lot, DO 01/24/2019 8:07 PM        ------------------------------------------------------------------------------------------------------------------------------    Subjective:     HPI: Sydney Grimes is a 33 y.o. female who presents to [facility] today for issues as discussed below.  Had her baby in early February, three weeks ago.  This is her third child.  Her other children are age 52 and 108.  Her children's names are [name],  and [name].  Her husband is a Designer, television/film set and knows about a new migraine medicine.  Migraines Notes she has had migraines for a while, worsening after the birth of her first son.  Her mom gets migraines really badly as well.  Notes when she was pregnant, the migraines were so bad that she had to get the doctor to prescribe  her something.  Patient confirms that this medication was prescription Fioricet.  Notes "[Fioricet] worked while I was pregnant," but she tried to take one a couple of weeks ago "and it didn't touch [the migraine]."  States she is getting migraines at least a few times per month, but isn't quite sure how long or how often she has been getting them.  States that nothing works to stop the migraine unless she takes one of her mom's migraine pills.  Says "usually I take half of it with two ibuprofen, and that seems to work."  Her migraines typically start in her neck.  She tries to knock her migraines out with ibuprofen, which doesn't work nine times out of ten.  She experiences nausea and ear pain, "like her ears on fire," and pain from her neck to her head.    Wt Readings from Last 3 Encounters:  01/24/19 139 lb 1.6 oz (63.1 kg)  11/22/18 149 lb (67.6 kg)  10/18/18 145 lb (65.8 kg)   BP Readings from Last 3 Encounters:  01/24/19 119/83  11/22/18 (!) 103/55  10/18/18 108/67   Pulse Readings from Last 3 Encounters:  01/24/19 76  11/22/18 69  10/18/18 84   BMI Readings from Last 3 Encounters:  01/24/19 24.64 kg/m  11/22/18 26.39 kg/m  10/18/18 25.69 kg/m     Patient Care Team    Relationship Specialty Notifications Start End  Thomasene Lot, DO PCP - General Family Medicine  08/02/17   Alm Bustard, MD Referring Physician Obstetrics and Gynecology  08/10/17    Comment: [facility]     Patient Active Problem List   Diagnosis Date Noted  . Migraine without aura and without status migrainosus, not intractable 01/24/2019  . Fibrocystic breast changes- neg Mammo  08/10/2017    Past Medical history, Surgical history, Family history, Social history, Allergies and Medications have been entered into the medical record, reviewed and changed as needed.    No outpatient medications have been marked as taking for the 01/24/19 encounter (Office Visit) with Thomasene Lot, DO.     Allergies:  Allergies  Allergen Reactions  . Amoxicillin Hives     Review of Systems:  A fourteen system review of systems was performed and found to be positive as per HPI.   Objective:   Blood pressure 119/83, pulse 76, temperature 98 F (36.7 C), height 5\' 3"  (1.6 m), weight 139 lb 1.6 oz (63.1 kg), last menstrual period 04/06/2018, SpO2 99 %, not currently breastfeeding. Body mass index is 24.64 kg/m. General:  Well Developed, well nourished, appropriate for stated age.  Neuro:  Alert and oriented,  extra-ocular muscles intact  HEENT:  Normocephalic, atraumatic, neck supple, no carotid bruits appreciated  Skin:  no gross rash, warm, pink. Cardiac:  RRR, S1 S2 Respiratory:  ECTA B/L and A/P, Not using accessory muscles, speaking in full sentences- unlabored. Vascular:  Ext warm, no cyanosis apprec.; cap RF less 2 sec. Psych:  No HI/SI, judgement and insight good, Euthymic mood. Full Affect.

## 2019-02-21 ENCOUNTER — Other Ambulatory Visit: Payer: 59

## 2019-02-21 ENCOUNTER — Encounter: Payer: 59 | Admitting: Family Medicine

## 2019-11-15 ENCOUNTER — Telehealth: Payer: Self-pay | Admitting: Family Medicine

## 2019-11-15 NOTE — Telephone Encounter (Signed)
Patient called seeking advice & or Appt for a Lump on upper chest/ ABD --per patient has been there for several weeks & causing her some pain/ discomfort  ---Advised would forward message to medical asst -- w/ provider & that someone would reach out to her  W/ Advice.  --phone# [phone]  --glh

## 2019-11-16 NOTE — Telephone Encounter (Signed)
Patient would need an apt to be seen for this issue. Please contact patient to schedule next available apt as (IN PATIENT). Thank you. AS, CMA

## 2019-12-11 ENCOUNTER — Other Ambulatory Visit: Payer: Self-pay

## 2019-12-12 ENCOUNTER — Telehealth: Payer: Self-pay | Admitting: *Deleted

## 2019-12-12 ENCOUNTER — Other Ambulatory Visit (HOSPITAL_COMMUNITY)
Admission: RE | Admit: 2019-12-12 | Discharge: 2019-12-12 | Disposition: A | Discharge status: Home / Self Care | Payer: Managed Care, Other (non HMO) | Source: Ambulatory Visit | Attending: Obstetrics and Gynecology | Admitting: Obstetrics and Gynecology

## 2019-12-12 ENCOUNTER — Ambulatory Visit: Payer: Managed Care, Other (non HMO) | Admitting: Obstetrics and Gynecology

## 2019-12-12 ENCOUNTER — Encounter: Payer: Self-pay | Admitting: Obstetrics and Gynecology

## 2019-12-12 VITALS — BP 122/70 | HR 79 | Temp 98.6°F | Ht 63.0 in | Wt 137.0 lb

## 2019-12-12 DIAGNOSIS — N644 Mastodynia: Secondary | ICD-10-CM

## 2019-12-12 DIAGNOSIS — Z0000 Encounter for general adult medical examination without abnormal findings: Secondary | ICD-10-CM | POA: Diagnosis not present

## 2019-12-12 DIAGNOSIS — Z01419 Encounter for gynecological examination (general) (routine) without abnormal findings: Secondary | ICD-10-CM | POA: Diagnosis not present

## 2019-12-12 DIAGNOSIS — Z124 Encounter for screening for malignant neoplasm of cervix: Secondary | ICD-10-CM

## 2019-12-12 DIAGNOSIS — N6325 Unspecified lump in the left breast, overlapping quadrants: Secondary | ICD-10-CM | POA: Diagnosis not present

## 2019-12-12 DIAGNOSIS — R0789 Other chest pain: Secondary | ICD-10-CM

## 2019-12-12 NOTE — Telephone Encounter (Signed)
Spoke with Scarlette Calico at [facility]. Patient scheduled for bilateral Dx MMG and left breast US, if needed, on 12/20/19 at 2pm, arrive at 1:40pm.    Spoke with patient, advised of appt date and time. Patient verbalizes understanding and is agreeable.   Patient placed in MMG hold.   Routing to provider for final review. Patient is agreeable to disposition. Will close encounter.

## 2019-12-12 NOTE — Patient Instructions (Addendum)
EXERCISE AND DIET:  We recommended that you start or continue a regular exercise program for good health. Regular exercise means any activity that makes your heart beat faster and makes you sweat.  We recommend exercising at least 30 minutes per day at least 3 days a week, preferably 4 or 5.  We also recommend a diet low in fat and sugar.  Inactivity, poor dietary choices and obesity can cause diabetes, heart attack, stroke, and kidney damage, among others.    ALCOHOL AND SMOKING:  Women should limit their alcohol intake to no more than 7 drinks/beers/glasses of wine (combined, not each!) per week. Moderation of alcohol intake to this level decreases your risk of breast cancer and liver damage. And of course, no recreational drugs are part of a healthy lifestyle.  And absolutely no smoking or even second hand smoke. Most people know smoking can cause heart and lung diseases, but did you know it also contributes to weakening of your bones? Aging of your skin?  Yellowing of your teeth and nails?  CALCIUM AND VITAMIN D:  Adequate intake of calcium and Vitamin D are recommended.  The recommendations for exact amounts of these supplements seem to change often, but generally speaking 1,000 mg of calcium (between diet and supplement) and 800 units of Vitamin D per day seems prudent. Certain women may benefit from higher intake of Vitamin D.  If you are among these women, your doctor will have told you during your visit.    PAP SMEARS:  Pap smears, to check for cervical cancer or precancers,  have traditionally been done yearly, although recent scientific advances have shown that most women can have pap smears less often.  However, every woman still should have a physical exam from her gynecologist every year. It will include a breast check, inspection of the vulva and vagina to check for abnormal growths or skin changes, a visual exam of the cervix, and then an exam to evaluate the size and shape of the uterus and  ovaries.  And after 36 years of age, a rectal exam is indicated to check for rectal cancers. We will also provide age appropriate advice regarding health maintenance, like when you should have certain vaccines, screening for sexually transmitted diseases, bone density testing, colonoscopy, mammograms, etc.   MAMMOGRAMS:  All women over 40 years old should have a yearly mammogram. Many facilities now offer a "3D" mammogram, which may cost around $50 extra out of pocket. If possible,  we recommend you accept the option to have the 3D mammogram performed.  It both reduces the number of women who will be called back for extra views which then turn out to be normal, and it is better than the routine mammogram at detecting truly abnormal areas.    COLON CANCER SCREENING: Now recommend starting at age 45. At this time colonoscopy is not covered for routine screening until 50. There are take home tests that can be done between 45-49.   COLONOSCOPY:  Colonoscopy to screen for colon cancer is recommended for all women at age 50.  We know, you hate the idea of the prep.  We agree, BUT, having colon cancer and not knowing it is worse!!  Colon cancer so often starts as a polyp that can be seen and removed at colonscopy, which can quite literally save your life!  And if your first colonoscopy is normal and you have no family history of colon cancer, most women don't have to have it again for   10 years.  Once every ten years, you can do something that may end up saving your life, right?  We will be happy to help you get it scheduled when you are ready.  Be sure to check your insurance coverage so you understand how much it will cost.  It may be covered as a preventative service at no cost, but you should check your particular policy.      Breast Self-Awareness Breast self-awareness means being familiar with how your breasts look and feel. It involves checking your breasts regularly and reporting any changes to your  health care provider. Practicing breast self-awareness is important. A change in your breasts can be a sign of a serious medical problem. Being familiar with how your breasts look and feel allows you to find any problems early, when treatment is more likely to be successful. All women should practice breast self-awareness, including women who have had breast implants. How to do a breast self-exam One way to learn what is normal for your breasts and whether your breasts are changing is to do a breast self-exam. To do a breast self-exam: Look for Changes  1. Remove all the clothing above your waist. 2. Stand in front of a mirror in a room with good lighting. 3. Put your hands on your hips. 4. Push your hands firmly downward. 5. Compare your breasts in the mirror. Look for differences between them (asymmetry), such as: ? Differences in shape. ? Differences in size. ? Puckers, dips, and bumps in one breast and not the other. 6. Look at each breast for changes in your skin, such as: ? Redness. ? Scaly areas. 7. Look for changes in your nipples, such as: ? Discharge. ? Bleeding. ? Dimpling. ? Redness. ? A change in position. Feel for Changes Carefully feel your breasts for lumps and changes. It is best to do this while lying on your back on the floor and again while sitting or standing in the shower or tub with soapy water on your skin. Feel each breast in the following way:  Place the arm on the side of the breast you are examining above your head.  Feel your breast with the other hand.  Start in the nipple area and make  inch (2 cm) overlapping circles to feel your breast. Use the pads of your three middle fingers to do this. Apply light pressure, then medium pressure, then firm pressure. The light pressure will allow you to feel the tissue closest to the skin. The medium pressure will allow you to feel the tissue that is a little deeper. The firm pressure will allow you to feel the tissue  close to the ribs.  Continue the overlapping circles, moving downward over the breast until you feel your ribs below your breast.  Move one finger-width toward the center of the body. Continue to use the  inch (2 cm) overlapping circles to feel your breast as you move slowly up toward your collarbone.  Continue the up and down exam using all three pressures until you reach your armpit.  Write Down What You Find  Write down what is normal for each breast and any changes that you find. Keep a written record with breast changes or normal findings for each breast. By writing this information down, you do not need to depend only on memory for size, tenderness, or location. Write down where you are in your menstrual cycle, if you are still menstruating. If you are having trouble noticing differences   in your breasts, do not get discouraged. With time you will become more familiar with the variations in your breasts and more comfortable with the exam. How often should I examine my breasts? Examine your breasts every month. If you are breastfeeding, the best time to examine your breasts is after a feeding or after using a breast pump. If you menstruate, the best time to examine your breasts is 5-7 days after your period is over. During your period, your breasts are lumpier, and it may be more difficult to notice changes. When should I see my health care provider? See your health care provider if you notice:  A change in shape or size of your breasts or nipples.  A change in the skin of your breast or nipples, such as a reddened or scaly area.  Unusual discharge from your nipples.  A lump or thick area that was not there before.  Pain in your breasts.  Anything that concerns you.   Chest Wall Pain Chest wall pain is pain in or around the bones and muscles of your chest. Sometimes, an injury causes this pain. Excessive coughing or overuse of arm and chest muscles may also cause chest wall pain.  Sometimes, the cause may not be known. This pain may take several weeks or longer to get better. Follow these instructions at home: Managing pain, stiffness, and swelling   If directed, put ice on the painful area: ? Put ice in a plastic bag. ? Place a towel between your skin and the bag. ? Leave the ice on for 20 minutes, 2-3 times per day. Activity  Rest as told by your health care provider.  Avoid activities that cause pain. These include any activities that use your chest muscles or your abdominal and side muscles to lift heavy items. Ask your health care provider what activities are safe for you. General instructions   Take over-the-counter and prescription medicines only as told by your health care provider.  Do not use any products that contain nicotine or tobacco, such as cigarettes, e-cigarettes, and chewing tobacco. These can delay healing after injury. If you need help quitting, ask your health care provider.  Keep all follow-up visits as told by your health care provider. This is important. Contact a health care provider if:  You have a fever.  Your chest pain becomes worse.  You have new symptoms. Get help right away if:  You have nausea or vomiting.  You feel sweaty or light-headed.  You have a cough with mucus from your lungs (sputum) or you cough up blood.  You develop shortness of breath. These symptoms may represent a serious problem that is an emergency. Do not wait to see if the symptoms will go away. Get medical help right away. Call your local emergency services (911 in the [place] Do not drive yourself to the hospital. Summary  Chest wall pain is pain in or around the bones and muscles of your chest.  Depending on the cause, it may be treated with ice, rest, medicines, and avoiding activities that cause pain.  Contact a health care provider if you have a fever, worsening chest pain, or new symptoms.  Get help right away if you feel light-headed or  you develop shortness of breath. These symptoms may be an emergency. This information is not intended to replace advice given to you by your health care provider. Make sure you discuss any questions you have with your health care provider. Document Revised: 05/05/2018 Document Reviewed:  05/05/2018 Elsevier Patient Education  [place].  Breast Tenderness Breast tenderness is a common problem for women of all ages, but may also occur in men. Breast tenderness may range from mild discomfort to severe pain. In women, the pain usually comes and goes with the menstrual cycle, but it can also be constant. Breast tenderness has many possible causes, including hormone changes, infections, and taking certain medicines. You may have tests, such as a mammogram or an ultrasound, to check for any unusual findings. Having breast tenderness usually does not mean that you have breast cancer. Follow these instructions at home: Managing pain and discomfort   If directed, put ice to the painful area. To do this: ? Put ice in a plastic bag. ? Place a towel between your skin and the bag. ? Leave the ice on for 20 minutes, 2-3 times a day.  Wear a supportive bra, especially during exercise. You may also want to wear a supportive bra while sleeping if your breasts are very tender. Medicines  Take over-the-counter and prescription medicines only as told by your health care provider. If the cause of your pain is infection, you may be prescribed an antibiotic medicine.  If you were prescribed an antibiotic, take it as told by your health care provider. Do not stop taking the antibiotic even if you start to feel better. Eating and drinking  Your health care provider may recommend that you lessen the amount of fat in your diet. You can do this by: ? Limiting fried foods. ? Cooking foods using methods such as baking, boiling, grilling, and broiling.  Decrease the amount of caffeine in your diet. Instead,  drink more water and choose caffeine-free drinks. General instructions   Keep a log of the days and times when your breasts are most tender.  Ask your health care provider how to do breast exams at home. This will help you notice if you have an unusual growth or lump.  Keep all follow-up visits as told by your health care provider. This is important. Contact a health care provider if:  Any part of your breast is hard, red, and hot to the touch. This may be a sign of infection.  You are a woman and: ? Not breastfeeding and you have fluid, especially blood or pus, coming out of your nipples. ? Have a new or painful lump in your breast that remains after your menstrual period ends.  You have a fever.  Your pain does not improve or it gets worse.  Your pain is interfering with your daily activities. Summary  Breast tenderness may range from mild discomfort to severe pain.  Breast tenderness has many possible causes, including hormone changes, infections, and taking certain medicines.  It can be treated with ice, wearing a supportive bra, and medicines.  Make changes to your diet if told to by your health care provider. This information is not intended to replace advice given to you by your health care provider. Make sure you discuss any questions you have with your health care provider. Document Revised: 03/27/2019 Document Reviewed: 03/27/2019 Elsevier Patient Education  [place].

## 2019-12-12 NOTE — Telephone Encounter (Signed)
-----   Message from Romualdo Bolk, MD sent at 12/12/2019  9:35 AM EST ----- Please set this patient up for diagnostic breast imaging, left breast lump at 9 o'clock, left breast and axillary pain.  Thanks, Noreene Larsson

## 2019-12-12 NOTE — Progress Notes (Signed)
34 y.o. [id number] Unknown White or Caucasian Not Hispanic or Latino female here for annual exam and to get established. She is having some swelling near her upper left breast she states that it is sore. It started last summer, feels more swollen and more tender.  She has a one year old, never nursed.  Period Cycle (Days): 28 Period Duration (Days): 7 Period Pattern: Regular Menstrual Flow: Moderate Menstrual Control: Thin pad Menstrual Control Change Freq (Hours): 3-4 Dysmenorrhea: (!) Mild Dysmenorrhea Symptoms: Cramping  No dyspareunia.   Patient's last menstrual period was 11/25/2019.          Sexually active: Yes.    The current method of family planning is vasectomy.    Exercising: Yes.    weights and cardio  Smoker:  no  Health Maintenance: Pap:  2018 maybe  History of abnormal Pap:  Yes, f/u was normal.  TDaP:  2018 Gardasil: completed.    reports that she has never smoked. She has never used smokeless tobacco. She reports previous alcohol use of about 4.0 standard drinks of alcohol per week. She reports that she does not use drugs. She works in [employer]. Kids are 5,4 and 1 (boy, girl and boy).   Past Medical History:  Diagnosis Date  . Medical history non-contributory   . Migraine without aura     Past Surgical History:  Procedure Laterality Date  . NO PAST SURGERIES      Current Outpatient Medications  Medication Sig Dispense Refill  . SUMAtriptan Succinate (ONZETRA XSAIL) 11 MG/NOSEPC EXHP Place 1 capsule into the nose as needed. 1 cap into ea nostril at onset HA, then may repeat 2hrs 30 each 1   No current facility-administered medications for this visit.    Family History  Problem Relation Age of Onset  . Hypertension Father     Review of Systems  All other systems reviewed and are negative.   Exam:   BP 122/70   Pulse 79   Temp 98.6 F (37 C)   Ht 5\' 3"  (1.6 m)   Wt 137 lb (62.1 kg)   LMP 11/25/2019   SpO2 95%   BMI 24.27 kg/m   Weight  change: @WEIGHTCHANGE @ Height:   Height: 5\' 3"  (160 cm)  Ht Readings from Last 3 Encounters:  12/12/19 5\' 3"  (1.6 m)  01/24/19 5\' 3"  (1.6 m)  09/20/18 5\' 3"  (1.6 m)    General appearance: alert, cooperative and appears stated age Head: Normocephalic, without obvious abnormality, atraumatic Neck: no adenopathy, supple, symmetrical, trachea midline and thyroid normal to inspection and palpation Lungs: clear to auscultation bilaterally Cardiovascular: regular rate and rhythm Breasts: in the left breast, just outside of the areolar region is an ~1 cm irregularly shaped, mobile lump. She has tenderness in her upper left breast, difficult to distinguish if it is her breast or her chest wall. No lumps in the area of tenderness.  Abdomen: soft, non-tender; non distended,  no masses,  no organomegaly Extremities: extremities normal, atraumatic, no cyanosis or edema Skin: Skin color, texture, turgor normal. No rashes or lesions Lymph nodes: Cervical, supraclavicular, and axillary nodes normal. No abnormal inguinal nodes palpated Neurologic: Grossly normal   Pelvic: External genitalia:  no lesions              Urethra:  normal appearing urethra with no masses, tenderness or lesions              Bartholins and Skenes: normal  Vagina: normal appearing vagina with normal color and discharge, no lesions              Cervix: no lesions               Bimanual Exam:  Uterus:  normal size, contour, position, consistency, mobility, non-tender and anteverted              Adnexa: no mass, fullness, tenderness               Rectovaginal: Confirms               Anus:  normal sphincter tone, no lesions  Carolynn Serve chaperoned for the exam.  A:  Well Woman with normal exam  Breast lump left at 9 o'clock  Breast pain vs chest wall pain   P:   Pap with hpv  Screening labs  Discussed breast self exam  Discussed calcium and vit D intake  Diagnostic breast imaging

## 2019-12-13 LAB — COMPREHENSIVE METABOLIC PANEL
ALT: 10 IU/L (ref 0–32)
AST: 17 IU/L (ref 0–40)
Albumin/Globulin Ratio: 1.6 (ref 1.2–2.2)
Albumin: 4.3 g/dL (ref 3.8–4.8)
Alkaline Phosphatase: 61 IU/L (ref 39–117)
BUN/Creatinine Ratio: 11 (ref 9–23)
BUN: 8 mg/dL (ref 6–20)
Bilirubin Total: 0.4 mg/dL (ref 0.0–1.2)
CO2: 22 mmol/L (ref 20–29)
Calcium: 9 mg/dL (ref 8.7–10.2)
Chloride: 106 mmol/L (ref 96–106)
Creatinine, Ser: 0.73 mg/dL (ref 0.57–1.00)
GFR calc Af Amer: 123 mL/min/{1.73_m2} (ref >59)
GFR calc non Af Amer: 107 mL/min/{1.73_m2} (ref >59)
Globulin, Total: 2.7 g/dL (ref 1.5–4.5)
Glucose: 81 mg/dL (ref 65–99)
Potassium: 4.1 mmol/L (ref 3.5–5.2)
Sodium: 140 mmol/L (ref 134–144)
Total Protein: 7 g/dL (ref 6.0–8.5)

## 2019-12-13 LAB — CBC
Hematocrit: 40.8 % (ref 34.0–46.6)
Hemoglobin: 13.9 g/dL (ref 11.1–15.9)
MCH: 32.1 pg (ref 26.6–33.0)
MCHC: 34.1 g/dL (ref 31.5–35.7)
MCV: 94 fL (ref 79–97)
Platelets: 220 10*3/uL (ref 150–450)
RBC: 4.33 x10E6/uL (ref 3.77–5.28)
RDW: 11.8 % (ref 11.7–15.4)
WBC: 5.3 10*3/uL (ref 3.4–10.8)

## 2019-12-13 LAB — LIPID PANEL
Chol/HDL Ratio: 2.2 ratio (ref 0.0–4.4)
Cholesterol, Total: 142 mg/dL (ref 100–199)
HDL: 66 mg/dL (ref >39)
LDL Chol Calc (NIH): 63 mg/dL (ref 0–99)
Triglycerides: 61 mg/dL (ref 0–149)
VLDL Cholesterol Cal: 13 mg/dL (ref 5–40)

## 2019-12-14 LAB — CYTOLOGY - PAP
Comment: NEGATIVE
Diagnosis: NEGATIVE
High risk HPV: NEGATIVE

## 2019-12-15 ENCOUNTER — Telehealth: Payer: Self-pay | Admitting: Family Medicine

## 2019-12-15 NOTE — Telephone Encounter (Signed)
---  Left message for pt to call office.  --glh

## 2019-12-20 ENCOUNTER — Ambulatory Visit
Admission: RE | Admit: 2019-12-20 | Discharge: 2019-12-20 | Disposition: A | Discharge status: Home / Self Care | Payer: Managed Care, Other (non HMO) | Source: Ambulatory Visit | Attending: Obstetrics and Gynecology | Admitting: Obstetrics and Gynecology

## 2019-12-20 ENCOUNTER — Other Ambulatory Visit: Payer: Self-pay

## 2019-12-20 DIAGNOSIS — N6325 Unspecified lump in the left breast, overlapping quadrants: Secondary | ICD-10-CM

## 2019-12-20 DIAGNOSIS — N644 Mastodynia: Secondary | ICD-10-CM

## 2019-12-21 ENCOUNTER — Telehealth: Payer: Self-pay | Admitting: *Deleted

## 2019-12-21 NOTE — Telephone Encounter (Signed)
-----   Message from Romualdo Bolk, MD sent at 12/21/2019  9:21 AM EST ----- Negative breast imaging, take out of mammogram hold. Please set the patient up for a repeat breast exam in 3 months to document stability.

## 2019-12-21 NOTE — Telephone Encounter (Signed)
Leda Min, RN  12/21/2019 10:10 AM EST    Left message to call Noreene Larsson, RN at [facility] [phone].    Patient removed from [facility] hold.

## 2019-12-27 NOTE — Telephone Encounter (Signed)
Spoke with patient, advised as seen below per Dr. Oscar La. OV scheduled for 03/14/20 at 8:30am. Patient verbalizes understanding and is agreeable.   Encounter closed.

## 2020-03-11 ENCOUNTER — Telehealth: Payer: Self-pay | Admitting: Obstetrics and Gynecology

## 2020-03-11 NOTE — Telephone Encounter (Signed)
Patient cancelled 3 month breast check because of her son's baseball schedule. She will call back to reschedule.

## 2020-03-14 ENCOUNTER — Ambulatory Visit: Payer: Self-pay | Admitting: Obstetrics and Gynecology

## 2020-05-27 IMAGING — MG DIGITAL DIAGNOSTIC BILAT W/ TOMO W/ CAD
8 series · 8 of 24 positions shown · non-contrast
Comparison: Previous mammogram 07/21/2017

CLINICAL DATA: 33-year-old patient presents for evaluation a lump
in the retroareolar left breast 9 o'clock position felt on clinical
physical exam. The patient is able to feel this area when she is
lying supine for the ultrasound today. The patient also has upper
outer left breast intermittent pain that radiates toward the axilla.

EXAM:
DIGITAL DIAGNOSTIC BILATERAL MAMMOGRAM WITH CAD AND TOMO
ULTRASOUND LEFT BREAST

[L CC synth-2D]
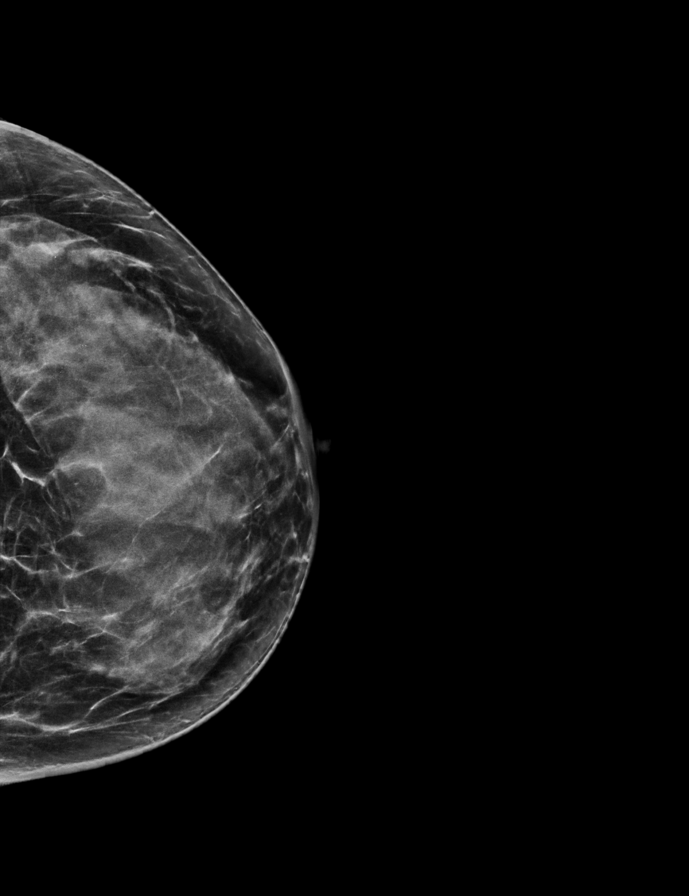

[R MLO synth-2D]
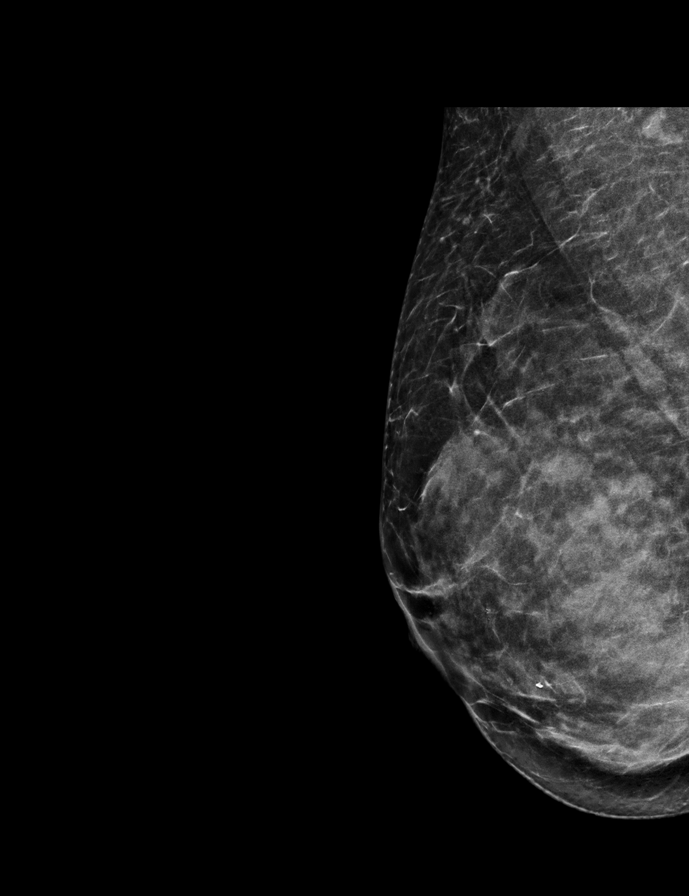

[R CC synth-2D]
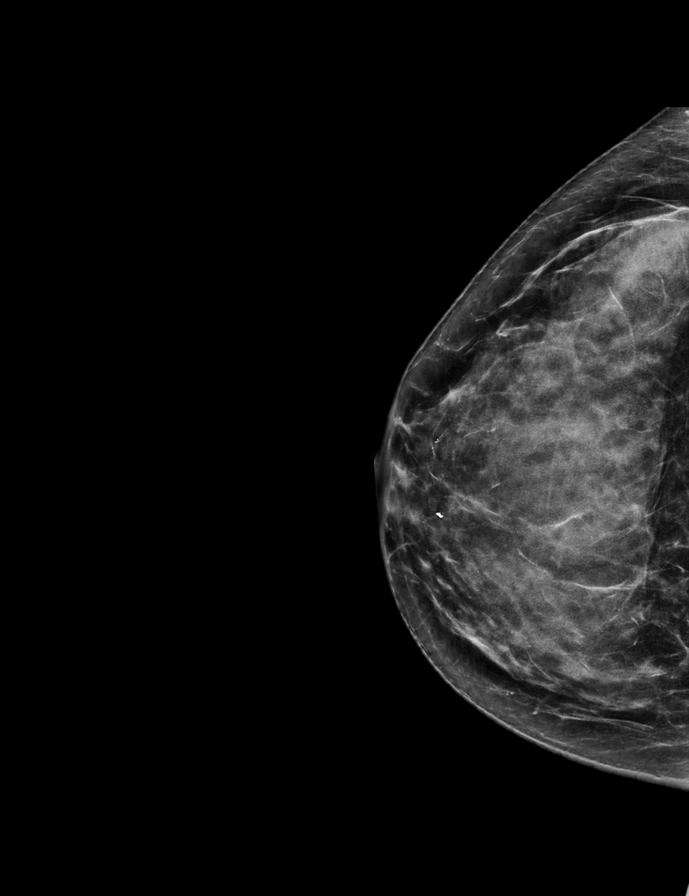

[L MLO synth-2D]
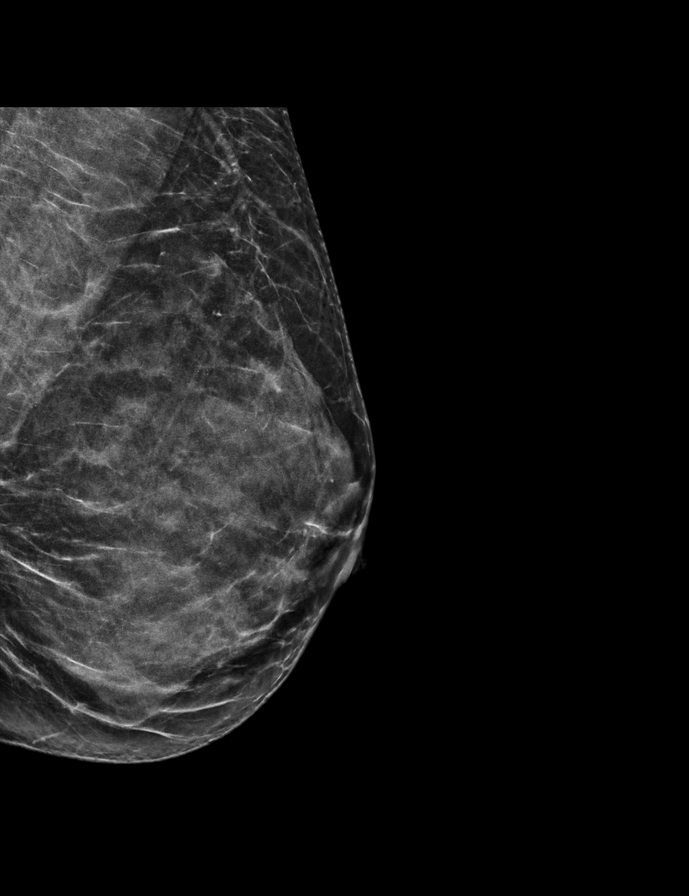

[L CC tomo · tomo slice 29/57.0]
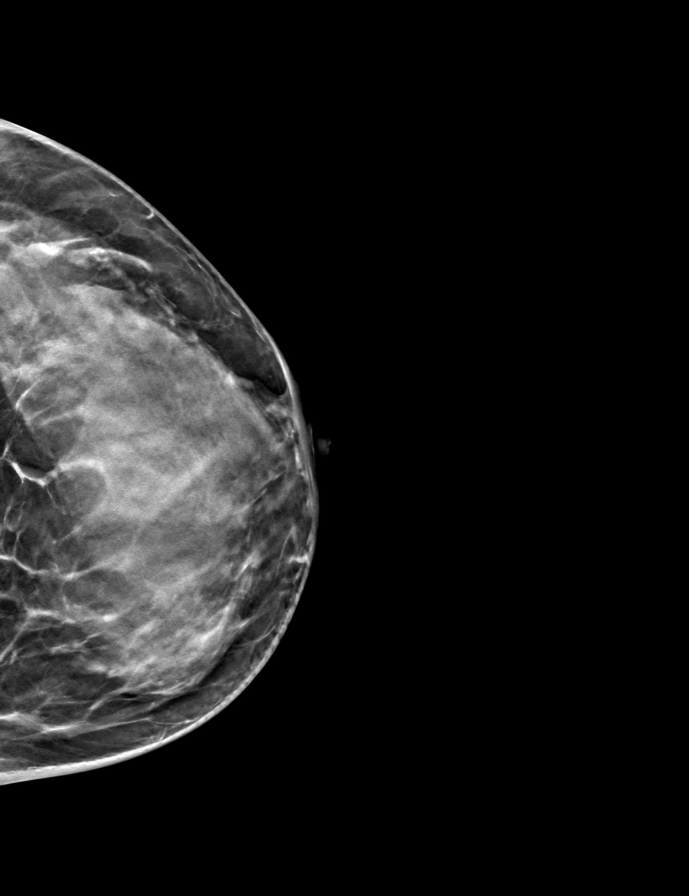

[L MLO tomo · tomo slice 27/52.0]
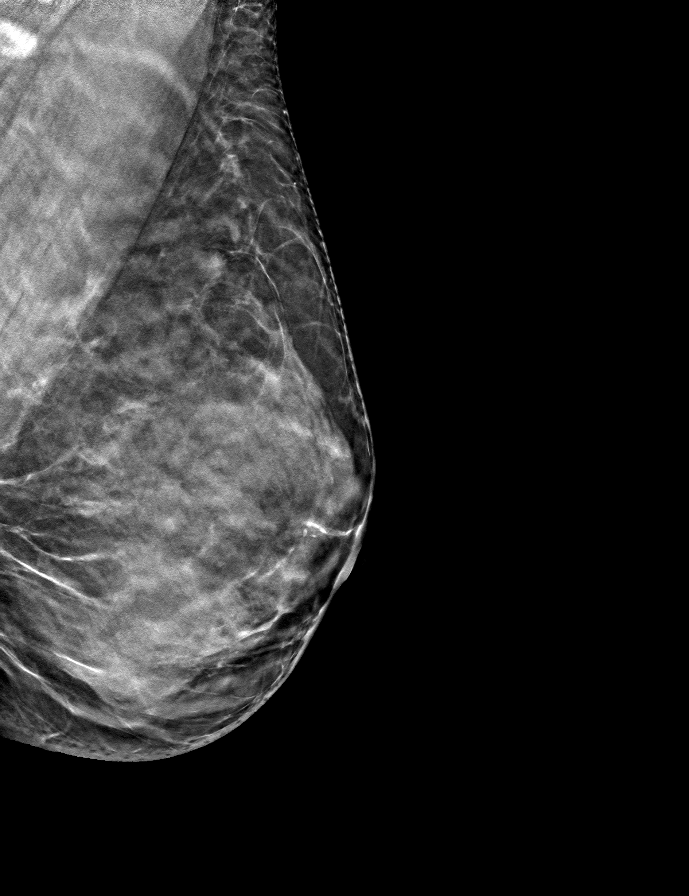

[R CC tomo · tomo slice 29/58.0]
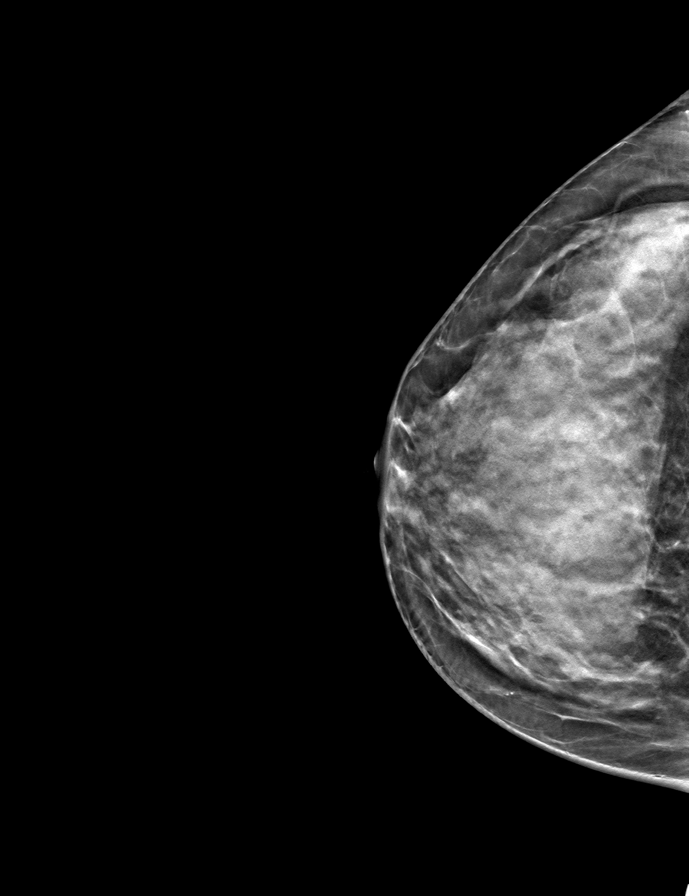

[R MLO tomo · tomo slice 28/55.0]
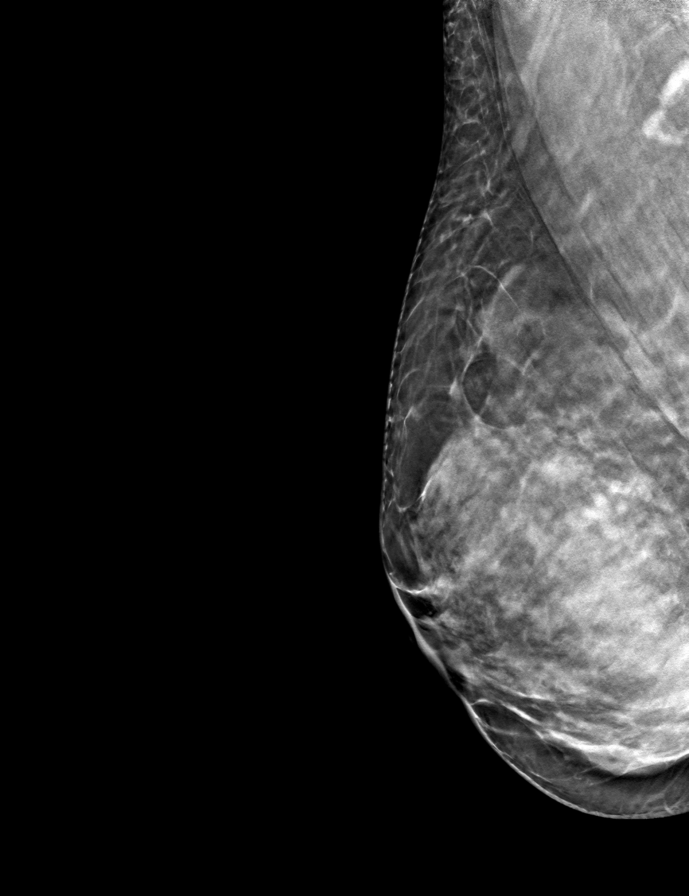

[8 of 24 positions shown; findings below may reference images not displayed]

ACR Breast Density Category c: The breast tissue is heterogeneously
dense, which may obscure small masses.
FINDINGS: No mass, suspicious microcalcification, or architectural distortion
is identified in either breast to suggest malignancy. Note is made
of a single of vessel in the right breast demonstrating
atherosclerotic calcification.

Mammographic images were processed with CAD.

On physical exam, I palpate soft nodularity in the retroareolar
periareolar left breast, without a suspicious mass.

Targeted ultrasound is performed, showing a normal fat lobule in the
region of patient concern 9 o'clock left breast 1 cm from the
nipple. There is adjacent dense glandular tissue. No mass or
abnormal shadowing is identified.
IMPRESSION: No evidence of malignancy in either breast. Normal ultrasound
appearance of the region of patient concern 9 o'clock periareolar.

RECOMMENDATION:
Screening mammogram at age 40 unless there are persistent or
intervening clinical concerns. (Code:Q2-F-NRE)

I have discussed the findings and recommendations with the patient.
If applicable, a reminder letter will be sent to the patient
regarding the next appointment.

BI-RADS CATEGORY  1: Negative.

## 2020-05-27 IMAGING — US US BREAST*L* LIMITED INC AXILLA
1 series · 3 of 3 positions shown · non-contrast
Comparison: Previous mammogram 07/21/2017

CLINICAL DATA: 33-year-old patient presents for evaluation a lump
in the retroareolar left breast 9 o'clock position felt on clinical
physical exam. The patient is able to feel this area when she is
lying supine for the ultrasound today. The patient also has upper
outer left breast intermittent pain that radiates toward the axilla.

EXAM:
DIGITAL DIAGNOSTIC BILATERAL MAMMOGRAM WITH CAD AND TOMO
ULTRASOUND LEFT BREAST

[Series 1: us breast*left* limited inc axilla · 0.06mm/px · 3 of 3 slices shown]
[im 1/3]
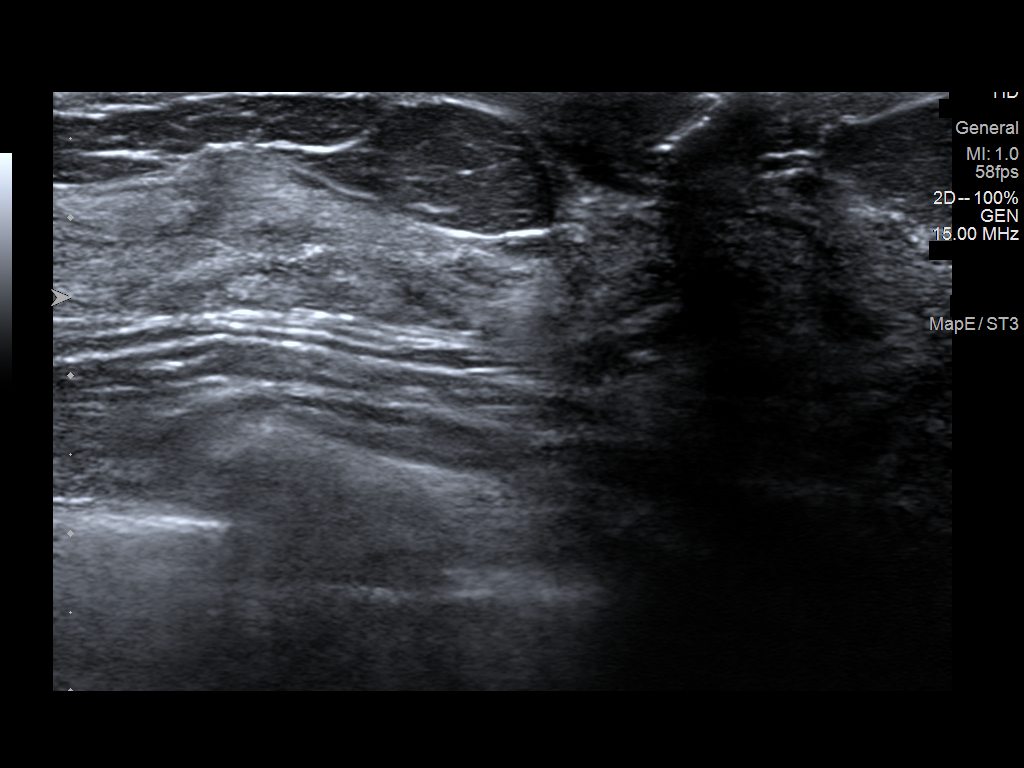
[im 2/3]
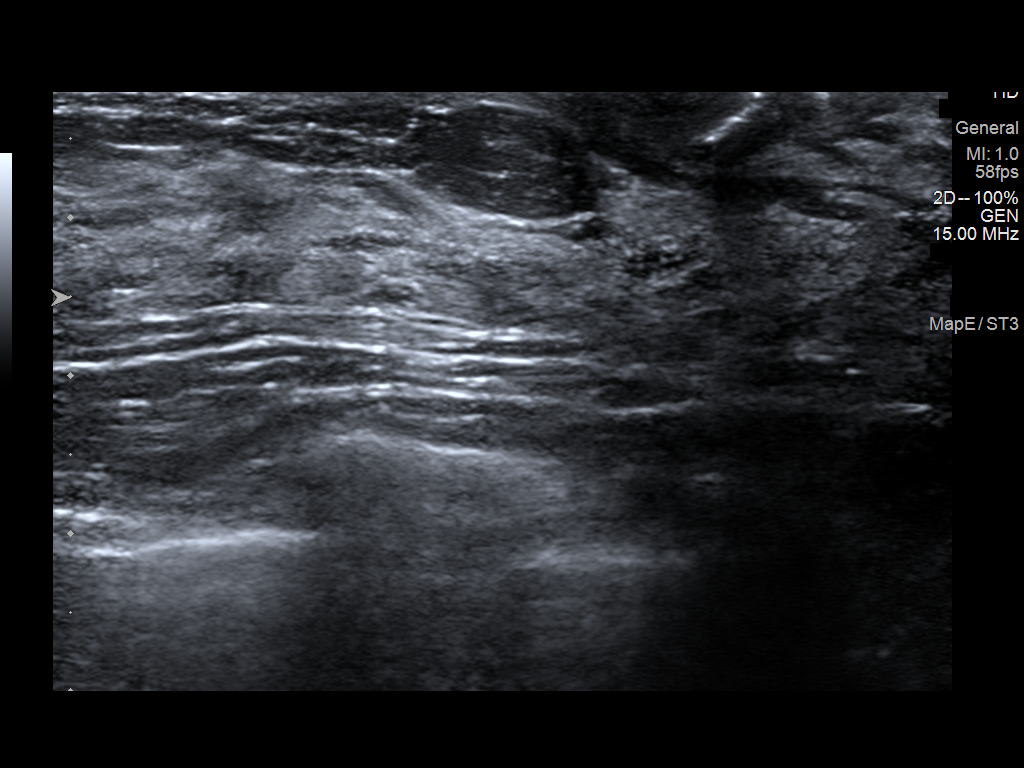
[im 3/3]
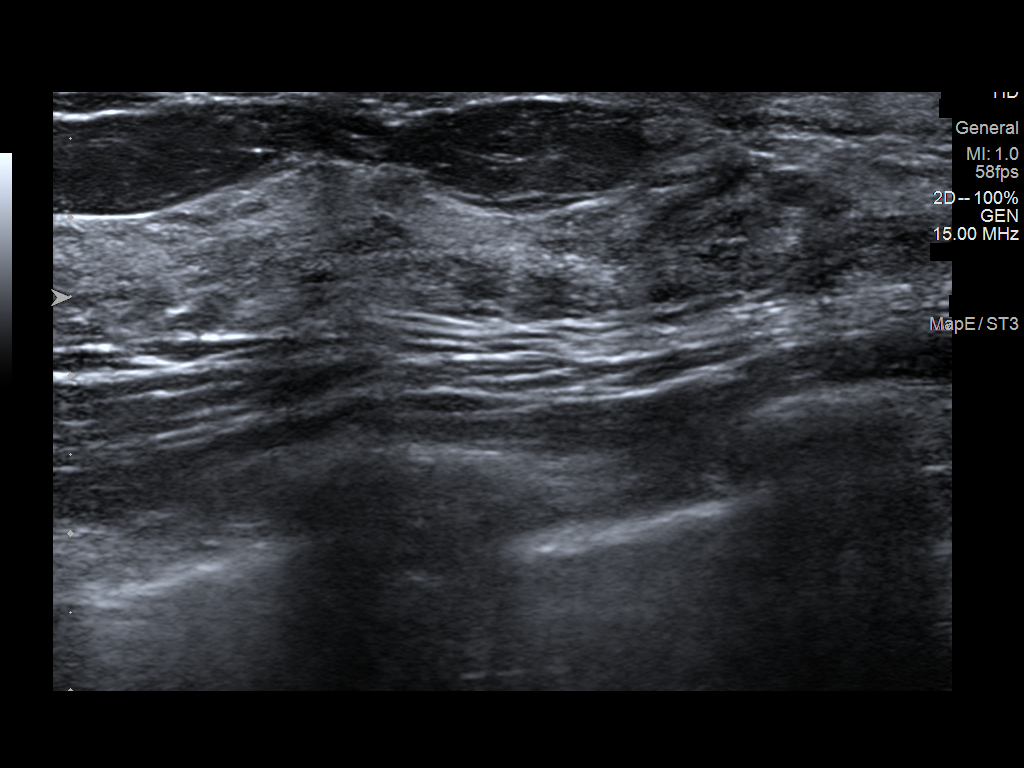

[3 of 3 positions shown; findings below may reference images not displayed]

ACR Breast Density Category c: The breast tissue is heterogeneously
dense, which may obscure small masses.
FINDINGS: No mass, suspicious microcalcification, or architectural distortion
is identified in either breast to suggest malignancy. Note is made
of a single of vessel in the right breast demonstrating
atherosclerotic calcification.

Mammographic images were processed with CAD.

On physical exam, I palpate soft nodularity in the retroareolar
periareolar left breast, without a suspicious mass.

Targeted ultrasound is performed, showing a normal fat lobule in the
region of patient concern 9 o'clock left breast 1 cm from the
nipple. There is adjacent dense glandular tissue. No mass or
abnormal shadowing is identified.
IMPRESSION: No evidence of malignancy in either breast. Normal ultrasound
appearance of the region of patient concern 9 o'clock periareolar.

RECOMMENDATION:
Screening mammogram at age 40 unless there are persistent or
intervening clinical concerns. (Code:Q2-F-NRE)

I have discussed the findings and recommendations with the patient.
If applicable, a reminder letter will be sent to the patient
regarding the next appointment.

BI-RADS CATEGORY  1: Negative.

## 2020-12-18 ENCOUNTER — Encounter: Payer: Self-pay | Admitting: Obstetrics and Gynecology

## 2020-12-18 ENCOUNTER — Ambulatory Visit (INDEPENDENT_AMBULATORY_CARE_PROVIDER_SITE_OTHER): Payer: Managed Care, Other (non HMO) | Admitting: Obstetrics and Gynecology

## 2020-12-18 ENCOUNTER — Other Ambulatory Visit: Payer: Self-pay

## 2020-12-18 VITALS — BP 126/70 | HR 68 | Ht 63.0 in | Wt 137.0 lb

## 2020-12-18 DIAGNOSIS — Z01419 Encounter for gynecological examination (general) (routine) without abnormal findings: Secondary | ICD-10-CM | POA: Diagnosis not present

## 2020-12-18 NOTE — Patient Instructions (Signed)

## 2020-12-18 NOTE — Progress Notes (Signed)
35 y.o. G80P3003 Married White or Caucasian Not Hispanic or Latino female here for annual exam.  No dyspareunia.  Period Cycle (Days): 28 Period Duration (Days): 6-7 Period Pattern: Regular Menstrual Flow: Moderate Menstrual Control: Maxi pad,Panty liner Menstrual Control Change Freq (Hours): 3-4 Dysmenorrhea: None   Last year the patient was noted to have a left breast lump, imaging was negative. She isn't aware of it currently.   Patient's last menstrual period was 11/21/2020.          Sexually active: Yes.    The current method of family planning is vasectomy.    Exercising: Yes.    weights and cardio Smoker:  no  Health Maintenance: Pap:  12/12/19 Neg:Neg HR HPV History of abnormal Pap:  Yes, f/u normal MMG:  12/20/19 MM/US BIRADS 1 negative/density c TDaP:  10/21/18 Gardasil: completed series   reports that she has never smoked. She has never used smokeless tobacco. She reports previous alcohol use of about 4.0 standard drinks of alcohol per week. She reports that she does not use drugs. She works in Audiological scientist. Kids are 33,5 and 2 (boy, girl and boy).   Past Medical History:  Diagnosis Date  . Medical history non-contributory   . Migraine without aura   Headaches are less frequent.   Past Surgical History:  Procedure Laterality Date  . NO PAST SURGERIES      Current Outpatient Medications  Medication Sig Dispense Refill  . SUMAtriptan Succinate (ONZETRA XSAIL) 11 MG/NOSEPC EXHP Place 1 capsule into the nose as needed. 1 cap into ea nostril at onset HA, then may repeat 2hrs 30 each 1   No current facility-administered medications for this visit.    Family History  Problem Relation Age of Onset  . Hypertension Father     Review of Systems  Constitutional: Negative.   HENT: Negative.   Eyes: Negative.   Respiratory: Negative.   Cardiovascular: Negative.   Gastrointestinal: Negative.   Endocrine: Negative.   Genitourinary: Negative.   Musculoskeletal: Negative.    Skin: Negative.   Allergic/Immunologic: Negative.   Neurological: Negative.   Hematological: Negative.   Psychiatric/Behavioral: Negative.     Exam:   BP 126/70 (BP Location: Left Arm, Patient Position: Sitting, Cuff Size: Normal)   Pulse 68   Ht 5\' 3"  (1.6 m)   Wt 137 lb (62.1 kg)   LMP 11/21/2020   BMI 24.27 kg/m   Weight change: @WEIGHTCHANGE @ Height:   Height: 5\' 3"  (160 cm)  Ht Readings from Last 3 Encounters:  12/18/20 5\' 3"  (1.6 m)  12/12/19 5\' 3"  (1.6 m)  01/24/19 5\' 3"  (1.6 m)    General appearance: alert, cooperative and appears stated age Head: Normocephalic, without obvious abnormality, atraumatic Neck: no adenopathy, supple, symmetrical, trachea midline and thyroid normal to inspection and palpation Lungs: clear to auscultation bilaterally Cardiovascular: regular rate and rhythm Breasts: in the left breast at 9 o'clock just outside the areolar region is an ~1 cm irregularly shaped, non tender, mobile lump (stable). No other lumps, no skin changes.  Abdomen: soft, non-tender; non distended,  no masses,  no organomegaly Extremities: extremities normal, atraumatic, no cyanosis or edema Skin: Skin color, texture, turgor normal. No rashes or lesions Lymph nodes: Cervical, supraclavicular, and axillary nodes normal. No abnormal inguinal nodes palpated Neurologic: Grossly normal   Pelvic: External genitalia:  no lesions              Urethra:  normal appearing urethra with no masses, tenderness or lesions  Bartholins and Skenes: normal                 Vagina: normal appearing vagina with normal color and discharge, no lesions              Cervix: no lesions               Bimanual Exam:  Uterus:  normal size, contour, position, consistency, mobility, non-tender              Adnexa: no mass, fullness, tenderness               Rectovaginal: Confirms               Anus:  normal sphincter tone, no lesions   1. Well woman exam Discussed breast self  exam Discussed calcium and vit D intake No labs this year Vasectomy for contraception

## 2021-12-15 NOTE — Progress Notes (Signed)
36 y.o. G14P3003 Married White or Caucasian Not Hispanic or Latino female here for annual exam.  No dyspareunia.  Period Cycle (Days): 28 Period Duration (Days): 7 Period Pattern: Regular Menstrual Flow: Heavy Menstrual Control: Maxi pad Menstrual Control Change Freq (Hours): 3 Dysmenorrhea: None  Patient's last menstrual period was 12/11/2021.          Sexually active: Yes.    The current method of family planning is vasectomy.    Exercising: Yes.    Gym/ health club routine includes cardio and light weights. Smoker:  no  Health Maintenance: Pap:  12/12/19 Neg:Neg HR HPV History of abnormal Pap:  yes f/u normal  MMG:  12/20/19 density C Bi-rads 1 neg  BMD:   none  Colonoscopy: none  TDaP:  10/21/2018  Gardasil: complete    reports that she has never smoked. She has never used smokeless tobacco. She reports that she does not currently use alcohol after a past usage of about 4.0 standard drinks per week. She reports that she does not use drugs. 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     Past Medical History:  Diagnosis Date   Medical history non-contributory    Migraine without aura     Past Surgical History:  Procedure Laterality Date   NO PAST SURGERIES      Current Outpatient Medications  Medication Sig Dispense Refill   SUMAtriptan Succinate (ONZETRA XSAIL) 11 MG/NOSEPC EXHP Place 1 capsule into the nose as needed. 1 cap into ea nostril at onset HA, then may repeat 2hrs 30 each 1   No current facility-administered medications for this visit.    Family History  Problem Relation Age of Onset   Hypertension Father     Review of Systems  All other systems reviewed and are negative.  Exam:   BP 110/64    Pulse 75    Ht 5\' 3"  (1.6 m)    Wt 143 lb (64.9 kg)    LMP 12/11/2021    SpO2 99%    BMI 25.33 kg/m   Weight change: @WEIGHTCHANGE @ Height:   Height: 5\' 3"  (160 cm)  Ht Readings from Last 3 Encounters:  12/24/21 5\' 3"  (1.6 m)   12/18/20 5\' 3"  (1.6 m)  12/12/19 5\' 3"  (1.6 m)    General appearance: alert, cooperative and appears stated age Head: Normocephalic, without obvious abnormality, atraumatic Neck: no adenopathy, supple, symmetrical, trachea midline and thyroid normal to inspection and palpation Lungs: clear to auscultation bilaterally Cardiovascular: regular rate and rhythm Breasts:  in the left breast at 9 o'clock, just outside the areolar region is a stable <1 cm lump. No other lumps, no skin changes.  Abdomen: soft, non-tender; non distended,  no masses,  no organomegaly Extremities: extremities normal, atraumatic, no cyanosis or edema Skin: Skin color, texture, turgor normal. No rashes or lesions Lymph nodes: Cervical, supraclavicular, and axillary nodes normal. No abnormal inguinal nodes palpated Neurologic: Grossly normal   Pelvic: External genitalia:  no lesions              Urethra:  normal appearing urethra with no masses, tenderness or lesions              Bartholins and Skenes: normal                 Vagina: normal appearing vagina with normal color and discharge, no lesions  Cervix: no lesions               Bimanual Exam:  Uterus:  normal size, contour, position, consistency, mobility, non-tender              Adnexa: no mass, fullness, tenderness               Rectovaginal: Confirms               Anus:  normal sphincter tone, no lesions  Carolynn Serve chaperoned for the exam.  1. Well woman exam Discussed breast self exam Discussed calcium and vit D intake No labs this year No pap this year

## 2021-12-24 ENCOUNTER — Ambulatory Visit (INDEPENDENT_AMBULATORY_CARE_PROVIDER_SITE_OTHER): Payer: Managed Care, Other (non HMO) | Admitting: Obstetrics and Gynecology

## 2021-12-24 ENCOUNTER — Other Ambulatory Visit: Payer: Self-pay

## 2021-12-24 ENCOUNTER — Encounter: Payer: Self-pay | Admitting: Obstetrics and Gynecology

## 2021-12-24 VITALS — BP 110/64 | HR 75 | Ht 63.0 in | Wt 143.0 lb

## 2021-12-24 DIAGNOSIS — Z01419 Encounter for gynecological examination (general) (routine) without abnormal findings: Secondary | ICD-10-CM | POA: Diagnosis not present

## 2021-12-24 NOTE — Patient Instructions (Signed)

## 2022-12-23 NOTE — Progress Notes (Signed)
37 y.o. G73P3003 Married White or Caucasian Not Hispanic or Latino female here for annual exam.    Menses q month x 5-6 days. Saturates a pad in 3 hours. No BTB. No cramps.  No dyspareunia.   No bowel or bladder changes.   Patient's last menstrual period was 12/24/2022.          Sexually active: Yes.    The current method of family planning is vasectomy.    Exercising: Yes.     Weights and walking  Smoker:  no  Health Maintenance: Pap:   12/12/19 Neg:Neg HR HPV  History of abnormal Pap:  yes f/u normal   MMG:  12/20/19 density C Bi-rads 1 neg  BMD:   none  Colonoscopy: none  TDaP:  10/21/2018  Gardasil: complete    reports that she has never smoked. She has never used smokeless tobacco. She reports that she does not currently use alcohol after a past usage of about 4.0 standard drinks of alcohol per week. She reports that she does not use drugs. She works in Press photographer. Boys are 8 and 4 (tomorrow), daughter is 7.   Past Medical History:  Diagnosis Date   Medical history non-contributory    Migraine without aura     Past Surgical History:  Procedure Laterality Date   NO PAST SURGERIES      Current Outpatient Medications  Medication Sig Dispense Refill   SUMAtriptan Succinate (ONZETRA XSAIL) 11 MG/NOSEPC EXHP Place 1 capsule into the nose as needed. 1 cap into ea nostril at onset HA, then may repeat 2hrs 30 each 1   No current facility-administered medications for this visit.    Family History  Problem Relation Age of Onset   Hypertension Father     Review of Systems  All other systems reviewed and are negative.   Exam:   BP 122/84   Pulse 76   Ht 5' 3"$  (1.6 m)   Wt 143 lb (64.9 kg)   LMP 12/24/2022   SpO2 100%   BMI 25.33 kg/m   Weight change: @WEIGHTCHANGE$ @ Height:   Height: 5' 3"$  (160 cm)  Ht Readings from Last 3 Encounters:  12/30/22 5' 3"$  (1.6 m)  12/24/21 5' 3"$  (1.6 m)  12/18/20 5' 3"$  (1.6 m)    General appearance: alert, cooperative and appears stated  age Head: Normocephalic, without obvious abnormality, atraumatic Neck: no adenopathy, supple, symmetrical, trachea midline and thyroid normal to inspection and palpation Lungs: clear to auscultation bilaterally Cardiovascular: regular rate and rhythm Breasts: in the left breast at 9 o'clock, just outside the areolar region is a stable <1 cm lump. No other lumps, no skin changes.   Abdomen: soft, non-tender; non distended,  no masses,  no organomegaly Extremities: extremities normal, atraumatic, no cyanosis or edema Skin: Skin color, texture, turgor normal. No rashes or lesions Lymph nodes: Cervical, supraclavicular, and axillary nodes normal. No abnormal inguinal nodes palpated Neurologic: Grossly normal   Pelvic: External genitalia:  no lesions              Urethra:  normal appearing urethra with no masses, tenderness or lesions              Bartholins and Skenes: normal                 Vagina: normal appearing vagina with normal color and discharge, no lesions              Cervix: no lesions  Bimanual Exam:  Uterus:  normal size, contour, position, consistency, mobility, non-tender              Adnexa: no mass, fullness, tenderness               Rectovaginal: Confirms               Anus:  normal sphincter tone, no lesions  Gae Dry, CMA chaperoned for the exam.  1. Well woman exam No pap this year Labs with primary Discussed breast self exam Discussed calcium and vit D intake

## 2022-12-29 DIAGNOSIS — G43909 Migraine, unspecified, not intractable, without status migrainosus: Secondary | ICD-10-CM | POA: Insufficient documentation

## 2022-12-30 ENCOUNTER — Ambulatory Visit (INDEPENDENT_AMBULATORY_CARE_PROVIDER_SITE_OTHER): Payer: Managed Care, Other (non HMO) | Admitting: Obstetrics and Gynecology

## 2022-12-30 ENCOUNTER — Encounter: Payer: Self-pay | Admitting: Obstetrics and Gynecology

## 2022-12-30 VITALS — BP 122/84 | HR 76 | Ht 63.0 in | Wt 143.0 lb

## 2022-12-30 DIAGNOSIS — Z01419 Encounter for gynecological examination (general) (routine) without abnormal findings: Secondary | ICD-10-CM | POA: Diagnosis not present

## 2022-12-30 NOTE — Patient Instructions (Signed)
